# Patient Record
Sex: Male | Born: 2000 | Race: White | Hispanic: Yes | Marital: Single | Smoking: Former smoker
Health system: Southern US, Community
[De-identification: ages and names within clinical notes are randomized; demographics above are authoritative.]

## PROBLEM LIST (undated history)

## (undated) DIAGNOSIS — Z789 Other specified health status: Secondary | ICD-10-CM

## (undated) DIAGNOSIS — K8020 Calculus of gallbladder without cholecystitis without obstruction: Secondary | ICD-10-CM

---

## 2019-12-21 ENCOUNTER — Other Ambulatory Visit: Payer: Self-pay

## 2019-12-21 ENCOUNTER — Encounter (HOSPITAL_BASED_OUTPATIENT_CLINIC_OR_DEPARTMENT_OTHER): Payer: Self-pay | Admitting: *Deleted

## 2019-12-21 DIAGNOSIS — R1013 Epigastric pain: Secondary | ICD-10-CM | POA: Insufficient documentation

## 2019-12-21 NOTE — ED Triage Notes (Signed)
C/o right upper abd pain x 1 year

## 2019-12-22 ENCOUNTER — Emergency Department (HOSPITAL_BASED_OUTPATIENT_CLINIC_OR_DEPARTMENT_OTHER): Payer: Self-pay

## 2019-12-22 ENCOUNTER — Encounter (HOSPITAL_BASED_OUTPATIENT_CLINIC_OR_DEPARTMENT_OTHER): Payer: Self-pay | Admitting: Emergency Medicine

## 2019-12-22 ENCOUNTER — Emergency Department (HOSPITAL_BASED_OUTPATIENT_CLINIC_OR_DEPARTMENT_OTHER)
Admission: EM | Admit: 2019-12-22 | Discharge: 2019-12-22 | Disposition: A | Discharge status: Home / Self Care | Payer: Self-pay | Attending: Emergency Medicine | Admitting: Emergency Medicine

## 2019-12-22 ENCOUNTER — Emergency Department (HOSPITAL_COMMUNITY): Admission: EM | Admit: 2019-12-22 | Discharge: 2019-12-22 | Discharge status: Absent without leave | Payer: Self-pay

## 2019-12-22 ENCOUNTER — Emergency Department (HOSPITAL_BASED_OUTPATIENT_CLINIC_OR_DEPARTMENT_OTHER)
Admit: 2019-12-22 | Discharge: 2019-12-22 | Disposition: A | Discharge status: Home / Self Care | Payer: Self-pay | Attending: Emergency Medicine | Admitting: Emergency Medicine

## 2019-12-22 ENCOUNTER — Other Ambulatory Visit: Payer: Self-pay

## 2019-12-22 ENCOUNTER — Encounter (HOSPITAL_BASED_OUTPATIENT_CLINIC_OR_DEPARTMENT_OTHER): Payer: Self-pay

## 2019-12-22 DIAGNOSIS — R110 Nausea: Secondary | ICD-10-CM | POA: Insufficient documentation

## 2019-12-22 DIAGNOSIS — Z20822 Contact with and (suspected) exposure to covid-19: Secondary | ICD-10-CM | POA: Insufficient documentation

## 2019-12-22 DIAGNOSIS — K8020 Calculus of gallbladder without cholecystitis without obstruction: Secondary | ICD-10-CM

## 2019-12-22 DIAGNOSIS — Z87891 Personal history of nicotine dependence: Secondary | ICD-10-CM | POA: Insufficient documentation

## 2019-12-22 DIAGNOSIS — R1013 Epigastric pain: Secondary | ICD-10-CM

## 2019-12-22 DIAGNOSIS — K810 Acute cholecystitis: Secondary | ICD-10-CM | POA: Insufficient documentation

## 2019-12-22 LAB — CBC WITH DIFFERENTIAL/PLATELET
Abs Immature Granulocytes: 0.07 10*3/uL (ref 0.00–0.07)
Abs Immature Granulocytes: 0.03 10*3/uL (ref 0.00–0.07)
Basophils Absolute: 0.1 10*3/uL (ref 0.0–0.1)
Basophils Absolute: 0 10*3/uL (ref 0.0–0.1)
Basophils Relative: 0 %
Basophils Relative: 0 %
Eosinophils Absolute: 0 10*3/uL (ref 0.0–0.5)
Eosinophils Absolute: 0.2 10*3/uL (ref 0.0–0.5)
Eosinophils Relative: 0 %
Eosinophils Relative: 2 %
HCT: 42.5 % (ref 39.0–52.0)
HCT: 45.4 % (ref 39.0–52.0)
Hemoglobin: 14.1 g/dL (ref 13.0–17.0)
Hemoglobin: 14.9 g/dL (ref 13.0–17.0)
Immature Granulocytes: 1 %
Immature Granulocytes: 0 %
Lymphocytes Relative: 11 %
Lymphocytes Relative: 22 %
Lymphs Abs: 1.7 10*3/uL (ref 0.7–4.0)
Lymphs Abs: 2.5 10*3/uL (ref 0.7–4.0)
MCH: 25.6 pg — ABNORMAL LOW (ref 26.0–34.0)
MCH: 25.5 pg — ABNORMAL LOW (ref 26.0–34.0)
MCHC: 33.2 g/dL (ref 30.0–36.0)
MCHC: 32.8 g/dL (ref 30.0–36.0)
MCV: 77.3 fL — ABNORMAL LOW (ref 80.0–100.0)
MCV: 77.7 fL — ABNORMAL LOW (ref 80.0–100.0)
Monocytes Absolute: 0.8 10*3/uL (ref 0.1–1.0)
Monocytes Absolute: 1 10*3/uL (ref 0.1–1.0)
Monocytes Relative: 6 %
Monocytes Relative: 9 %
Neutro Abs: 11.9 10*3/uL — ABNORMAL HIGH (ref 1.7–7.7)
Neutro Abs: 7.6 10*3/uL (ref 1.7–7.7)
Neutrophils Relative %: 82 %
Neutrophils Relative %: 67 %
Platelets: 306 10*3/uL (ref 150–400)
Platelets: 303 10*3/uL (ref 150–400)
RBC: 5.5 MIL/uL (ref 4.22–5.81)
RBC: 5.84 MIL/uL — ABNORMAL HIGH (ref 4.22–5.81)
RDW: 12.9 % (ref 11.5–15.5)
RDW: 13.2 % (ref 11.5–15.5)
WBC: 14.6 10*3/uL — ABNORMAL HIGH (ref 4.0–10.5)
WBC: 11.3 10*3/uL — ABNORMAL HIGH (ref 4.0–10.5)
nRBC: 0 % (ref 0.0–0.2)
nRBC: 0 % (ref 0.0–0.2)

## 2019-12-22 LAB — RESPIRATORY PANEL BY RT PCR (FLU A&B, COVID)
Influenza A by PCR: NEGATIVE
Influenza B by PCR: NEGATIVE
SARS Coronavirus 2 by RT PCR: NEGATIVE

## 2019-12-22 LAB — COMPREHENSIVE METABOLIC PANEL
ALT: 19 U/L (ref 0–44)
ALT: 18 U/L (ref 0–44)
AST: 21 U/L (ref 15–41)
AST: 18 U/L (ref 15–41)
Albumin: 4.2 g/dL (ref 3.5–5.0)
Albumin: 4.3 g/dL (ref 3.5–5.0)
Alkaline Phosphatase: 48 U/L (ref 38–126)
Alkaline Phosphatase: 53 U/L (ref 38–126)
Anion gap: 8 (ref 5–15)
Anion gap: 7 (ref 5–15)
BUN: 12 mg/dL (ref 6–20)
BUN: 9 mg/dL (ref 6–20)
CO2: 26 mmol/L (ref 22–32)
CO2: 27 mmol/L (ref 22–32)
Calcium: 9 mg/dL (ref 8.9–10.3)
Calcium: 9 mg/dL (ref 8.9–10.3)
Chloride: 100 mmol/L (ref 98–111)
Chloride: 104 mmol/L (ref 98–111)
Creatinine, Ser: 0.64 mg/dL (ref 0.61–1.24)
Creatinine, Ser: 0.91 mg/dL (ref 0.61–1.24)
GFR, Estimated: >60 mL/min (ref >60)
GFR, Estimated: >60 mL/min (ref >60)
Glucose, Bld: 121 mg/dL — ABNORMAL HIGH (ref 70–99)
Glucose, Bld: 96 mg/dL (ref 70–99)
Potassium: 3.9 mmol/L (ref 3.5–5.1)
Potassium: 3.8 mmol/L (ref 3.5–5.1)
Sodium: 134 mmol/L — ABNORMAL LOW (ref 135–145)
Sodium: 138 mmol/L (ref 135–145)
Total Bilirubin: 0.4 mg/dL (ref 0.3–1.2)
Total Bilirubin: 1 mg/dL (ref 0.3–1.2)
Total Protein: 7.3 g/dL (ref 6.5–8.1)
Total Protein: 7.6 g/dL (ref 6.5–8.1)

## 2019-12-22 LAB — LIPASE, BLOOD
Lipase: 25 U/L (ref 11–51)
Lipase: 25 U/L (ref 11–51)

## 2019-12-22 MED ORDER — DICYCLOMINE HCL 20 MG PO TABS: 20.0000 mg | ORAL_TABLET | Freq: Three times a day (TID) | ORAL | 0 refills | Status: DC | PRN

## 2019-12-22 MED ORDER — DICYCLOMINE HCL 10 MG PO CAPS
10.0000 mg | ORAL_CAPSULE | Freq: Once | ORAL | Status: AC
  Administered 2019-12-22: 10 mg via ORAL
  Filled 2019-12-22: qty 1

## 2019-12-22 MED ORDER — ONDANSETRON 4 MG PO TBDP
4.0000 mg | ORAL_TABLET | Freq: Once | ORAL | Status: AC
  Administered 2019-12-22: 4 mg via ORAL
  Filled 2019-12-22: qty 1

## 2019-12-22 MED ORDER — IOHEXOL 300 MG/ML  SOLN
100.0000 mL | Freq: Once | INTRAMUSCULAR | Status: AC | PRN
  Administered 2019-12-22: 100 mL via INTRAVENOUS

## 2019-12-22 MED ORDER — SODIUM CHLORIDE 0.9 % IV SOLN
INTRAVENOUS | Status: AC
  Filled 2019-12-22: qty 20

## 2019-12-22 MED ORDER — SODIUM CHLORIDE 0.9 % IV SOLN
2.0000 g | Freq: Once | INTRAVENOUS | Status: DC
  Filled 2019-12-22: qty 20

## 2019-12-22 MED ORDER — ONDANSETRON 4 MG PO TBDP: 4.0000 mg | ORAL_TABLET | Freq: Three times a day (TID) | ORAL | 0 refills | Status: DC | PRN

## 2019-12-22 MED ORDER — PANTOPRAZOLE SODIUM 20 MG PO TBEC: 20.0000 mg | DELAYED_RELEASE_TABLET | Freq: Every day | ORAL | 0 refills | Status: AC

## 2019-12-22 NOTE — Discharge Instructions (Addendum)
Stay on a low-fat diet, and drink plenty of fluids, like water.  Call the general surgeon on Monday morning for follow-up appointment.

## 2019-12-22 NOTE — ED Provider Notes (Addendum)
[facility] EMERGENCY DEPARTMENT Provider Note   CSN: [id number] Arrival date & time: 12/22/19  1447     History Chief Complaint  Patient presents with  . Abdominal Pain    Mike Hahn is a 19 y.o. male.  HPI      Mike Hahn is a 19 y.o. male, patient with no diagnosed past medical history, presenting to the ED with abdominal pain constant since yesterday evening around 6 PM. Patient states he has had intermittent pain in the right upper quadrant of the abdomen for the last couple years.  This worsened and became more frequent over the last week and has been constant since onset yesterday.  Occasional nausea.  Patient was evaluated in the ED last night.  There was concern for possible early cholecystitis.  Patient returned today for RUQ ultrasound, which continued to show concern for acute cholecystitis.  No COVID-19 vaccine.  Last meal was yesterday afternoon.  Last food of any kind was some crackers prior to discharge around 4 AM this morning.  Denies fever/chills, vomiting, diarrhea, hematochezia/melena, chest pain, shortness of breath, or any other complaints.    No past medical history on file.  There are no problems to display for this patient.   No past surgical history on file.     No family history on file.  Social History   Tobacco Use  . Smoking status: Former Games developer  . Smokeless tobacco: Never Used  Vaping Use  . Vaping Use: Never used  Substance Use Topics  . Alcohol use: Not Currently  . Drug use: Not Currently    Home Medications Prior to Admission medications   Medication Sig Start Date End Date Taking? Authorizing Provider  dicyclomine (BENTYL) 20 MG tablet Take 1 tablet (20 mg total) by mouth 3 (three) times daily as needed for spasms. 12/22/19   Long, Arlyss Repress, MD  ondansetron (ZOFRAN ODT) 4 MG disintegrating tablet Take 1 tablet (4 mg total) by mouth every 8 (eight) hours as needed for nausea. 12/22/19   Long, Arlyss Repress,  MD  pantoprazole (PROTONIX) 20 MG tablet Take 1 tablet (20 mg total) by mouth daily. 12/22/19 01/21/20  Long, Arlyss Repress, MD    Allergies    Patient has no known allergies.  Review of Systems   Review of Systems  Constitutional: Negative for chills, diaphoresis and fever.  Respiratory: Negative for cough and shortness of breath.   Cardiovascular: Negative for chest pain.  Gastrointestinal: Positive for abdominal pain and nausea. Negative for blood in stool, constipation, diarrhea and vomiting.  Genitourinary: Negative for dysuria and flank pain.  Musculoskeletal: Negative for back pain.  Neurological: Negative for syncope and weakness.  All other systems reviewed and are negative.   Physical Exam Updated Vital Signs BP 124/87 (BP Location: Left Arm)   Pulse 96   Temp 98.5 F (36.9 C) (Oral)   Resp 20   Ht 5\' 11"  (1.803 m)   Wt 98 kg   SpO2 98%   BMI 30.13 kg/m   Physical Exam Vitals and nursing note reviewed.  Constitutional:      General: He is not in acute distress.    Appearance: He is well-developed. He is not diaphoretic.  HENT:     Head: Normocephalic and atraumatic.     Mouth/Throat:     Mouth: Mucous membranes are moist.     Pharynx: Oropharynx is clear.  Eyes:     Conjunctiva/sclera: Conjunctivae normal.  Cardiovascular:     Rate and  Rhythm: Normal rate and regular rhythm.     Pulses: Normal pulses.          Radial pulses are 2+ on the right side and 2+ on the left side.       Posterior tibial pulses are 2+ on the right side and 2+ on the left side.     Heart sounds: Normal heart sounds.  Pulmonary:     Effort: Pulmonary effort is normal. No respiratory distress.     Breath sounds: Normal breath sounds.  Abdominal:     Palpations: Abdomen is soft.     Tenderness: There is abdominal tenderness in the right upper quadrant. There is no guarding. Positive signs include Murphy's sign.  Musculoskeletal:     Cervical back: Neck supple.     Right lower leg: No  edema.     Left lower leg: No edema.  Lymphadenopathy:     Cervical: No cervical adenopathy.  Skin:    General: Skin is warm and dry.  Neurological:     Mental Status: He is alert.  Psychiatric:        Mood and Affect: Mood and affect normal.        Speech: Speech normal.        Behavior: Behavior normal.     ED Results / Procedures / Treatments   Labs (all labs ordered are listed, but only abnormal results are displayed) Labs Reviewed  RESPIRATORY PANEL BY RT PCR (FLU A&B, COVID)  COMPREHENSIVE METABOLIC PANEL  CBC WITH DIFFERENTIAL/PLATELET  LIPASE, BLOOD    EKG None  Radiology CT ABDOMEN PELVIS W CONTRAST  Result Date: 12/22/2019 CLINICAL DATA:  Right upper quadrant abdominal pain EXAM: CT ABDOMEN AND PELVIS WITH CONTRAST TECHNIQUE: Multidetector CT imaging of the abdomen and pelvis was performed using the standard protocol following bolus administration of intravenous contrast. CONTRAST:  OMNIPAQUE IOHEXOL 300 MG/ML  SOLN COMPARISON:  None. FINDINGS: Lower chest: The visualized lung bases are clear bilaterally. The visualized heart and pericardium are unremarkable peer Hepatobiliary: The gallbladder is mildly distended and subtle Peri cholecystic inflammatory changes are identified within the porta hepatis surrounding the gallbladder neck suggesting early changes of acute cholecystitis. No intra or extrahepatic biliary ductal dilation. The liver is unremarkable. Pancreas: Unremarkable Spleen: Unremarkable Adrenals/Urinary Tract: The adrenal glands are unremarkable. The kidneys are normal in size and position. Normal cortical enhancement. There is mild bilateral hydronephrosis and hydroureter to the level of the bladder which appears moderately distended suggesting that this may relate to either bladder outlet obstruction or voluntary retention. No intrarenal or ureteral calculi are identified. Stomach/Bowel: The stomach, small bowel, and large bowel are unremarkable. Appendix  normal. No free intraperitoneal gas or fluid. Vascular/Lymphatic: No significant vascular findings are present. No enlarged abdominal or pelvic lymph nodes. Reproductive: Prostate is unremarkable. Other: Rectum unremarkable. Musculoskeletal: No acute bone abnormality. IMPRESSION: Mild pericholecystic inflammatory changes suggestive of early acute cholecystitis. Correlation with liver enzymes may be helpful for further evaluation. HIDA scan may also be helpful to assess patency of the cystic duct. Mild bilateral hydronephrosis and moderate bladder distension suggesting changes of either bladder outlet obstruction or voluntary retention. Electronically Signed   By: Helyn Numbers MD   On: 12/22/2019 02:52   US Abdomen Limited RUQ/Gall Bladder  Result Date: 12/22/2019 CLINICAL DATA:  Epigastric and right upper quadrant abdominal pain for 1 year, worsening over the last week. Gallbladder abnormality on CT. EXAM: ULTRASOUND ABDOMEN LIMITED RIGHT UPPER QUADRANT COMPARISON:  CT same date. FINDINGS:  Gallbladder: There is an immobile 14 mm gallstone in the gallbladder neck. Mild gallbladder thickening to 4 mm is present. According to the sonographer, there is a sonographic Murphy sign. Common bile duct: Diameter: 3 mm Liver: No focal lesion identified. Within normal limits in parenchymal echogenicity. Portal vein is patent on color Doppler imaging with normal direction of blood flow towards the liver. Other: None. IMPRESSION: 1. Nonmobile gallstone in the gallbladder neck with mild gallbladder wall thickening and a sonographic Murphy sign. Findings are supportive of acute cholecystitis as suggested on earlier CT. 2. No evidence of biliary dilatation. Electronically Signed   By: Carey Bullocks M.D.   On: 12/22/2019 14:24    Procedures Procedures (including critical care time)  Medications Ordered in ED Medications  cefTRIAXone (ROCEPHIN) 2 g in sodium chloride 0.9 % 100 mL IVPB (has no administration in time  range)    ED Course  I have reviewed the triage vital signs and the nursing notes.  Pertinent labs & imaging results that were available during my care of the patient were reviewed by me and considered in my medical decision making (see chart for details).  Clinical Course as of Dec 22 1598  Sat Dec 22, 2019  1552 Spoke with Dr. Carolynne Edouard, general surgeon.  Recommends patient be transferred to the ED at [facility].  He will evaluate him there.   [SJ]  1553 Spoke with Dr. Effie Shy, EDP at [facility].  Agrees to accept the transfer.   [SJ]    Clinical Course User Index [SJ] Astella Desir, Lorella Nimrod   MDM Rules/Calculators/A&P                          Patient presented to our ED after completing RUQ ultrasound. Ultrasound with findings concerning for acute cholecystitis.  Patient does have consistent right upper quadrant abdominal pain. Patient is nontoxic appearing, afebrile, not tachycardic, not tachypneic, not hypotensive, excellent SPO2 on room air, and is in no apparent distress.   I have reviewed the patient's chart to obtain more information.   I reviewed and interpreted the patient's labs and radiological studies. He will be in ED to ED transfer to [facility] emergency department.  He has requested to do this POV.  We counseled the patient on proceeding directly to the emergency department without stopping and while remaining n.p.o. Results of repeat lab work pending at time of transfer.  Patient will be driven to the ED by a friend who is present at the bedside.  Upon arrival at the [facility], ED, he will need antibiotics initiated and surgery notified of his arrival.  Findings and plan of care discussed with Marianna Fuss, MD.   Final Clinical Impression(s) / ED Diagnoses Final diagnoses:  Acute cholecystitis    Rx / DC Orders ED Discharge Orders    None       Concepcion Living 12/22/19 1605    Anselm Pancoast, PA-C 12/22/19 1606    Milagros Loll, MD 12/22/19  [phone]

## 2019-12-22 NOTE — ED Provider Notes (Addendum)
4:50 PM-patient presented apparently from [facility], to see general surgery regarding gallstone with signs and symptoms of acute cholecystitis.  Patient reports having similar pain for 1 year.  Not previously diagnosed with gallbladder disorder.  Will page general surgery to report that the patient has arrived.   Mancel Bale, MD 12/22/19 1657  7:45 PM-patient has been seen by general surgery who have discussed the situation with the patient.  Patient and surgeon plan on outpatient follow-up for cholecystectomy, electively.  Dr. Carolynne Edouard will have his office contact the patient and I will give the patient Dr. Billey Chang number to call on Monday.  Patient will be given instructions for low-fat diet.  I have reviewed patient's vital signs, he remained stable, and comfortable.    Mancel Bale, MD 12/22/19 [phone]

## 2019-12-22 NOTE — ED Notes (Signed)
Pt reports that he was told to come here to see surgery from [facility]. Pt handed this RN his paperwork, being seen for acute cholecystitis.

## 2019-12-22 NOTE — ED Provider Notes (Signed)
Emergency Department Provider Note   I have reviewed the triage vital signs and the nursing notes.   HISTORY  Chief Complaint Abdominal Pain   HPI Mike Hahn is a 19 y.o. male presents to the emergency department for evaluation of epigastric and right upper quadrant abdominal pain which is been intermittent over the past year but worsening significantly in the past 7 days.  He states that in the past he has had occasional discomfort in the epigastric and RUQ region.  Pain does not radiate up into the chest.  He is not having shortness of breath or fever.  He has not noticed any association with eating or drinking.  He typically takes Motrin and pain resolves.  He has not seen a physician regarding this pain in the past.  Over the past week the pain has become more severe and constant.  No lower abdominal discomfort.  No urinary tract infection symptoms.  No back pain.    History reviewed. No pertinent past medical history.  There are no problems to display for this patient.   History reviewed. No pertinent surgical history.  Allergies Patient has no known allergies.  No family history on file.  Social History Social History   Tobacco Use  . Smoking status: Never Smoker  . Smokeless tobacco: Never Used  Vaping Use  . Vaping Use: Never used  Substance Use Topics  . Alcohol use: Not Currently  . Drug use: Not Currently    Review of Systems  Constitutional: No fever/chills Eyes: No visual changes. ENT: No sore throat. Cardiovascular: Denies chest pain. Respiratory: Denies shortness of breath. Gastrointestinal: Positive epigastric and RUQ abdominal pain.  No nausea, no vomiting.  No diarrhea.  No constipation. Genitourinary: Negative for dysuria. Musculoskeletal: Negative for back pain. Skin: Negative for rash. Neurological: Negative for headaches, focal weakness or numbness.  10-point ROS otherwise  negative.  ____________________________________________   PHYSICAL EXAM:  VITAL SIGNS: ED Triage Vitals  Enc Vitals Group     BP 12/21/19 2305 (!) 152/84     Pulse Rate 12/21/19 2305 74     Resp 12/21/19 2305 18     Temp 12/21/19 2305 98.2 F (36.8 C)     Temp Source 12/21/19 2305 Oral     SpO2 12/21/19 2305 100 %     Weight 12/21/19 2303 216 lb (98 kg)     Height 12/21/19 2303 5\' 11"  (1.803 m)   Constitutional: Alert and oriented. Well appearing and in no acute distress. Eyes: Conjunctivae are normal.  Head: Atraumatic. Nose: No congestion/rhinnorhea. Mouth/Throat: Mucous membranes are moist. Neck: No stridor. Cardiovascular: Normal rate, regular rhythm. Good peripheral circulation. Grossly normal heart sounds.   Respiratory: Normal respiratory effort.  No retractions. Lungs CTAB. Gastrointestinal: Soft with mild tenderness in the epigastric region. . No rebound or guarding. Negative Murphy's sign. No distention.  Musculoskeletal: No gross deformities of extremities. Neurologic:  Normal speech and language.  Skin:  Skin is warm, dry and intact. No rash noted.   ____________________________________________   LABS (all labs ordered are listed, but only abnormal results are displayed)  Labs Reviewed  COMPREHENSIVE METABOLIC PANEL - Abnormal; Notable for the following components:      Result Value   Sodium 134 (*)    Glucose, Bld 121 (*)    All other components within normal limits  CBC WITH DIFFERENTIAL/PLATELET - Abnormal; Notable for the following components:   WBC 14.6 (*)    MCV 77.3 (*)    MCH 25.6 (*)  Neutro Abs 11.9 (*)    All other components within normal limits  LIPASE, BLOOD   ____________________________________________  RADIOLOGY  CT ABDOMEN PELVIS W CONTRAST  Result Date: 12/22/2019 CLINICAL DATA:  Right upper quadrant abdominal pain EXAM: CT ABDOMEN AND PELVIS WITH CONTRAST TECHNIQUE: Multidetector CT imaging of the abdomen and pelvis was  performed using the standard protocol following bolus administration of intravenous contrast. CONTRAST:  OMNIPAQUE IOHEXOL 300 MG/ML  SOLN COMPARISON:  None. FINDINGS: Lower chest: The visualized lung bases are clear bilaterally. The visualized heart and pericardium are unremarkable peer Hepatobiliary: The gallbladder is mildly distended and subtle Peri cholecystic inflammatory changes are identified within the porta hepatis surrounding the gallbladder neck suggesting early changes of acute cholecystitis. No intra or extrahepatic biliary ductal dilation. The liver is unremarkable. Pancreas: Unremarkable Spleen: Unremarkable Adrenals/Urinary Tract: The adrenal glands are unremarkable. The kidneys are normal in size and position. Normal cortical enhancement. There is mild bilateral hydronephrosis and hydroureter to the level of the bladder which appears moderately distended suggesting that this may relate to either bladder outlet obstruction or voluntary retention. No intrarenal or ureteral calculi are identified. Stomach/Bowel: The stomach, small bowel, and large bowel are unremarkable. Appendix normal. No free intraperitoneal gas or fluid. Vascular/Lymphatic: No significant vascular findings are present. No enlarged abdominal or pelvic lymph nodes. Reproductive: Prostate is unremarkable. Other: Rectum unremarkable. Musculoskeletal: No acute bone abnormality. IMPRESSION: Mild pericholecystic inflammatory changes suggestive of early acute cholecystitis. Correlation with liver enzymes may be helpful for further evaluation. HIDA scan may also be helpful to assess patency of the cystic duct. Mild bilateral hydronephrosis and moderate bladder distension suggesting changes of either bladder outlet obstruction or voluntary retention. Electronically Signed   By: Helyn Numbers MD   On: 12/22/2019 02:52    ____________________________________________   PROCEDURES  Procedure(s) performed:   Procedures  None   ____________________________________________   INITIAL IMPRESSION / ASSESSMENT AND PLAN / ED COURSE  Pertinent labs & imaging results that were available during my care of the patient were reviewed by me and considered in my medical decision making (see chart for details).   Patient presents to the emergency department with epigastric and right upper quadrant abdominal pain present for the past year but worsening significantly in the last 7 days.  No prior labs or imaging in our system for review.  Plan for CMP, lipase, CT abdomen pelvis given his history and exam.  Exceedingly low suspicion for cardiopulmonary etiology of symptoms.   03:15 AM  Patient CT imaging reviewed.  LFTs and bilirubin are normal.  There is some inflammation surrounding the gallbladder with raises the possibility of an early acute cholecystitis but my suspicion for this overall is relatively low.  On reexamination of his abdomen he has no significant tenderness in the right upper quadrant.  Negative Murphy sign.  Patient symptoms are more consistent with a gastritis.  He does have leukocytosis but is afebrile here with otherwise normal vital signs.  Discussed symptoms with the patient and plan to bring him back tomorrow when ultrasound is available for right upper quadrant ultrasound to better assess the gallbladder.  Will p.o. challenge here prior to discharge. Patient is urinating here without difficulty. Suspect voluntary retention while in CT.   Patient eating and drinking in the ED without worsening symptoms. Plan for 1PM return for RUQ [place]. Discussed staying NPO prior to study. Discussed ED return precautions.  ____________________________________________  FINAL CLINICAL IMPRESSION(S) / ED DIAGNOSES  Final diagnoses:  Epigastric pain  MEDICATIONS GIVEN DURING THIS VISIT:  Medications  iohexol (OMNIPAQUE) 300 MG/ML solution 100 mL (100 mLs Intravenous Contrast Given 12/22/19 0201)  dicyclomine (BENTYL)  capsule 10 mg (10 mg Oral Given 12/22/19 0334)  ondansetron (ZOFRAN-ODT) disintegrating tablet 4 mg (4 mg Oral Given 12/22/19 0334)     NEW OUTPATIENT MEDICATIONS STARTED DURING THIS VISIT:  New Prescriptions   DICYCLOMINE (BENTYL) 20 MG TABLET    Take 1 tablet (20 mg total) by mouth 3 (three) times daily as needed for spasms.   ONDANSETRON (ZOFRAN ODT) 4 MG DISINTEGRATING TABLET    Take 1 tablet (4 mg total) by mouth every 8 (eight) hours as needed for nausea.   PANTOPRAZOLE (PROTONIX) 20 MG TABLET    Take 1 tablet (20 mg total) by mouth daily.    Note:  This document was prepared using Dragon voice recognition software and may include unintentional dictation errors.  Alona Bene, MD, [facility] Emergency Medicine    Masiel Gentzler, Arlyss Repress, MD 12/22/19 [phone]

## 2019-12-22 NOTE — ED Notes (Signed)
ED Provider at bedside. 

## 2019-12-22 NOTE — Discharge Instructions (Addendum)
IMPORTANT PATIENT INSTRUCTIONS:  You have been scheduled for an Outpatient Ultrasound.    Your appointment has been scheduled for:  __1    PM later today.   If your appointment is scheduled for a Saturday, Sunday or holiday, please go to the [facility] Emergency Department Registration Desk at least 15 minutes prior to your appointment time and tell them you are there for an ultrasound.    Do not eat or drink anything prior to this test as eating can affect how we interpret the study.   If your appointment is scheduled for a weekday (Monday - Friday), please go directly to the [facility] Radiology Department reception area at least 15 minutes prior to your appointment time and tell them you are there for an ultrasound.    Please call [phone] with questions.

## 2019-12-22 NOTE — Consult Note (Signed)
Reason for Consult:abd pain Referring Physician: Dr. Sharlett Iles Mike Hahn is an 19 y.o. male.  HPI: The patient is a 19 year old white male who has been complaining of epigastric pain for the last year.  It seems to be worse over the last 24 hours.  He denies any fevers or chills.  He denies any nausea or vomiting.  He went to the emergency department where imaging showed that he did have stones in the gallbladder.  His liver functions were normal.  No past medical history on file.  No past surgical history on file.  No family history on file.  Social History:  reports that he has quit smoking. He has never used smokeless tobacco. He reports previous alcohol use. He reports previous drug use.  Allergies: No Known Allergies  Medications: I have reviewed the patient's current medications.  Results for orders placed or performed during the hospital encounter of 12/22/19 (from the past 48 hour(s))  Comprehensive metabolic panel     Status: None   Collection Time: 12/22/19  3:54 PM  Result Value Ref Range   Sodium 138 135 - 145 mmol/L   Potassium 3.8 3.5 - 5.1 mmol/L   Chloride 104 98 - 111 mmol/L   CO2 27 22 - 32 mmol/L   Glucose, Bld 96 70 - 99 mg/dL    Comment: Glucose reference range applies only to samples taken after fasting for at least 8 hours.   BUN 9 6 - 20 mg/dL   Creatinine, Ser 2.35 0.61 - 1.24 mg/dL   Calcium 9.0 8.9 - 57.3 mg/dL   Total Protein 7.6 6.5 - 8.1 g/dL   Albumin 4.3 3.5 - 5.0 g/dL   AST 18 15 - 41 U/L   ALT 18 0 - 44 U/L   Alkaline Phosphatase 53 38 - 126 U/L   Total Bilirubin 1.0 0.3 - 1.2 mg/dL   GFR, Estimated >22 >02 mL/min   Anion gap 7 5 - 15    Comment: Performed at [facility], [address] [place], [place] [address]  CBC with Differential     Status: Abnormal   Collection Time: 12/22/19  3:54 PM  Result Value Ref Range   WBC 11.3 (H) 4.0 - 10.5 K/uL   RBC 5.84 (H) 4.22 - 5.81 MIL/uL   Hemoglobin 14.9 13.0 - 17.0 g/dL   HCT  62.3 39 - 52 %   MCV 77.7 (L) 80.0 - 100.0 fL   MCH 25.5 (L) 26.0 - 34.0 pg   MCHC 32.8 30.0 - 36.0 g/dL   RDW 76.2 83.1 - 51.7 %   Platelets 303 150 - 400 K/uL   nRBC 0.0 0.0 - 0.2 %   Neutrophils Relative % 67 %   Neutro Abs 7.6 1.7 - 7.7 K/uL   Lymphocytes Relative 22 %   Lymphs Abs 2.5 0.7 - 4.0 K/uL   Monocytes Relative 9 %   Monocytes Absolute 1.0 0.1 - 1.0 K/uL   Eosinophils Relative 2 %   Eosinophils Absolute 0.2 0 - 0 K/uL   Basophils Relative 0 %   Basophils Absolute 0.0 0 - 0 K/uL   Immature Granulocytes 0 %   Abs Immature Granulocytes 0.03 0.00 - 0.07 K/uL    Comment: Performed at [facility], [address] [place], [place] [address]  Lipase, blood     Status: None   Collection Time: 12/22/19  3:54 PM  Result Value Ref Range   Lipase 25 11 -  51 U/L    Comment: Performed at [facility], [address] [place], [place] [address]  Respiratory Panel by RT PCR (Flu A&B, Covid) - Nasopharyngeal Swab     Status: None   Collection Time: 12/22/19  3:54 PM   Specimen: Nasopharyngeal Swab  Result Value Ref Range   SARS Coronavirus 2 by RT PCR NEGATIVE NEGATIVE    Comment: (NOTE) SARS-CoV-2 target nucleic acids are NOT DETECTED.  The SARS-CoV-2 RNA is generally detectable in upper respiratoy specimens during the acute phase of infection. The lowest concentration of SARS-CoV-2 viral copies this assay can detect is 131 copies/mL. A negative result does not preclude SARS-Cov-2 infection and should not be used as the sole basis for treatment or other patient management decisions. A negative result may occur with  improper specimen collection/handling, submission of specimen other than nasopharyngeal swab, presence of viral mutation(s) within the areas targeted by this assay, and inadequate number of viral copies (<131 copies/mL). A negative result must be combined with clinical observations, patient history, and epidemiological information.  The expected result is Negative.  Fact Sheet for Patients:  https://www.moore.com/  Fact Sheet for Healthcare Providers:  https://www.young.biz/  This test is no t yet approved or cleared by the [place] FDA and  has been authorized for detection and/or diagnosis of SARS-CoV-2 by FDA under an Emergency Use Authorization (EUA). This EUA will remain  in effect (meaning this test can be used) for the duration of the COVID-19 declaration under Section 564(b)(1) of the Act, 21 U.S.C. section 360bbb-3(b)(1), unless the authorization is terminated or revoked sooner.     Influenza A by PCR NEGATIVE NEGATIVE   Influenza B by PCR NEGATIVE NEGATIVE    Comment: (NOTE) The Xpert Xpress SARS-CoV-2/FLU/RSV assay is intended as an aid in  the diagnosis of influenza from Nasopharyngeal swab specimens and  should not be used as a sole basis for treatment. Nasal washings and  aspirates are unacceptable for Xpert Xpress SARS-CoV-2/FLU/RSV  testing.  Fact Sheet for Patients: https://www.moore.com/  Fact Sheet for Healthcare Providers: https://www.young.biz/  This test is not yet approved or cleared by the [place] FDA and  has been authorized for detection and/or diagnosis of SARS-CoV-2 by  FDA under an Emergency Use Authorization (EUA). This EUA will remain  in effect (meaning this test can be used) for the duration of the  Covid-19 declaration under Section 564(b)(1) of the Act, 21  U.S.C. section 360bbb-3(b)(1), unless the authorization is  terminated or revoked. Performed at [facility], [address] [place], [place] [address]     CT ABDOMEN PELVIS W CONTRAST  Result Date: 12/22/2019 CLINICAL DATA:  Right upper quadrant abdominal pain EXAM: CT ABDOMEN AND PELVIS WITH CONTRAST TECHNIQUE: Multidetector CT imaging of the abdomen and pelvis was performed using the standard protocol following  bolus administration of intravenous contrast. CONTRAST:  OMNIPAQUE IOHEXOL 300 MG/ML  SOLN COMPARISON:  None. FINDINGS: Lower chest: The visualized lung bases are clear bilaterally. The visualized heart and pericardium are unremarkable peer Hepatobiliary: The gallbladder is mildly distended and subtle Peri cholecystic inflammatory changes are identified within the porta hepatis surrounding the gallbladder neck suggesting early changes of acute cholecystitis. No intra or extrahepatic biliary ductal dilation. The liver is unremarkable. Pancreas: Unremarkable Spleen: Unremarkable Adrenals/Urinary Tract: The adrenal glands are unremarkable. The kidneys are normal in size and position. Normal cortical enhancement. There is mild bilateral hydronephrosis and hydroureter to the level of the bladder which  appears moderately distended suggesting that this may relate to either bladder outlet obstruction or voluntary retention. No intrarenal or ureteral calculi are identified. Stomach/Bowel: The stomach, small bowel, and large bowel are unremarkable. Appendix normal. No free intraperitoneal gas or fluid. Vascular/Lymphatic: No significant vascular findings are present. No enlarged abdominal or pelvic lymph nodes. Reproductive: Prostate is unremarkable. Other: Rectum unremarkable. Musculoskeletal: No acute bone abnormality. IMPRESSION: Mild pericholecystic inflammatory changes suggestive of early acute cholecystitis. Correlation with liver enzymes may be helpful for further evaluation. HIDA scan may also be helpful to assess patency of the cystic duct. Mild bilateral hydronephrosis and moderate bladder distension suggesting changes of either bladder outlet obstruction or voluntary retention. Electronically Signed   By: Helyn NumbersAshesh  Parikh MD   On: 12/22/2019 02:52   US Abdomen Limited RUQ/Gall Bladder  Result Date: 12/22/2019 CLINICAL DATA:  Epigastric and right upper quadrant abdominal pain for 1 year, worsening over the  last week. Gallbladder abnormality on CT. EXAM: ULTRASOUND ABDOMEN LIMITED RIGHT UPPER QUADRANT COMPARISON:  CT same date. FINDINGS: Gallbladder: There is an immobile 14 mm gallstone in the gallbladder neck. Mild gallbladder thickening to 4 mm is present. According to the sonographer, there is a sonographic Murphy sign. Common bile duct: Diameter: 3 mm Liver: No focal lesion identified. Within normal limits in parenchymal echogenicity. Portal vein is patent on color Doppler imaging with normal direction of blood flow towards the liver. Other: None. IMPRESSION: 1. Nonmobile gallstone in the gallbladder neck with mild gallbladder wall thickening and a sonographic Murphy sign. Findings are supportive of acute cholecystitis as suggested on earlier CT. 2. No evidence of biliary dilatation. Electronically Signed   By: Carey BullocksWilliam  Veazey M.D.   On: 12/22/2019 14:24    Review of Systems  Constitutional: Negative.   HENT: Negative.   Eyes: Negative.   Respiratory: Negative.   Cardiovascular: Negative.   Gastrointestinal: Positive for abdominal pain. Negative for nausea and vomiting.  Endocrine: Negative.   Genitourinary: Negative.   Musculoskeletal: Negative.   Skin: Negative.   Allergic/Immunologic: Negative.   Neurological: Negative.   Hematological: Negative.   Psychiatric/Behavioral: Negative.    Blood pressure 119/73, pulse 88, temperature 98.7 F (37.1 C), temperature source Oral, resp. rate 18, height 5\' 11"  (1.803 m), weight 98 kg, SpO2 100 %. Physical Exam Constitutional:      General: He is not in acute distress.    Appearance: Normal appearance. He is normal weight. He is not ill-appearing.  HENT:     Head: Normocephalic and atraumatic.     Right Ear: External ear normal.     Left Ear: External ear normal.     Nose: Nose normal.     Mouth/Throat:     Mouth: Mucous membranes are dry.  Eyes:     General: No scleral icterus.    Extraocular Movements: Extraocular movements intact.      Conjunctiva/sclera: Conjunctivae normal.     Pupils: Pupils are equal, round, and reactive to light.  Cardiovascular:     Rate and Rhythm: Normal rate and regular rhythm.     Pulses: Normal pulses.     Heart sounds: Normal heart sounds.     Comments: No pitting edema lower extr Abdominal:     General: Abdomen is flat. Bowel sounds are normal. There is no distension.     Tenderness: There is no abdominal tenderness.  Musculoskeletal:        General: No swelling, tenderness or deformity. Normal range of motion.     Cervical back:  Normal range of motion and neck supple. No tenderness.  Lymphadenopathy:     Comments: No palpable groin or cervical lymphadenopathy  Skin:    General: Skin is warm and dry.  Neurological:     General: No focal deficit present.     Mental Status: He is alert and oriented to person, place, and time.  Psychiatric:        Mood and Affect: Mood normal.        Behavior: Behavior normal.        Thought Content: Thought content normal.     Assessment/Plan: The patient appears to have symptomatic gallstones.  He currently has no abdominal pain and looks normal.  I have given them the option of staying in the hospital and having this done as the operating room schedule allows versus going home and having this scheduled in the next few weeks electively.  He would like to go home.  I will have the office call him on Monday to begin the scheduling process for elective cholecystectomy.  Chevis Pretty III 12/22/2019, 7:46 PM

## 2019-12-22 NOTE — ED Triage Notes (Signed)
Pt arrives pov, reports RUQ pain. States U/S today, was told to check in for abx.

## 2019-12-24 ENCOUNTER — Ambulatory Visit: Payer: Self-pay | Admitting: General Surgery

## 2020-01-22 ENCOUNTER — Ambulatory Visit: Payer: Self-pay | Admitting: General Surgery

## 2020-02-01 ENCOUNTER — Ambulatory Visit: Payer: Self-pay | Admitting: General Surgery

## 2020-03-24 ENCOUNTER — Other Ambulatory Visit (HOSPITAL_COMMUNITY)
Admission: RE | Admit: 2020-03-24 | Discharge: 2020-03-24 | Disposition: A | Discharge status: Home / Self Care | Payer: Self-pay | Source: Ambulatory Visit | Attending: General Surgery | Admitting: General Surgery

## 2020-03-24 DIAGNOSIS — Z01812 Encounter for preprocedural laboratory examination: Secondary | ICD-10-CM | POA: Insufficient documentation

## 2020-03-24 DIAGNOSIS — Z20822 Contact with and (suspected) exposure to covid-19: Secondary | ICD-10-CM | POA: Insufficient documentation

## 2020-03-24 LAB — SARS CORONAVIRUS 2 (TAT 6-24 HRS): SARS Coronavirus 2: NEGATIVE

## 2020-03-25 ENCOUNTER — Encounter (HOSPITAL_COMMUNITY): Payer: Self-pay | Admitting: General Surgery

## 2020-03-25 NOTE — Progress Notes (Signed)
Denies chest pain, shortness of breath, or cardiology visit. Educated on Museum/gallery conservator. Patient reports being in quarantine and will remain in quarantine until after surgery.

## 2020-03-27 ENCOUNTER — Encounter (HOSPITAL_COMMUNITY): Payer: Self-pay | Admitting: General Surgery

## 2020-03-27 ENCOUNTER — Other Ambulatory Visit: Payer: Self-pay

## 2020-03-27 ENCOUNTER — Ambulatory Visit (HOSPITAL_COMMUNITY): Payer: Self-pay

## 2020-03-27 ENCOUNTER — Ambulatory Visit (HOSPITAL_COMMUNITY): Payer: Self-pay | Admitting: Certified Registered Nurse Anesthetist

## 2020-03-27 ENCOUNTER — Ambulatory Visit (HOSPITAL_COMMUNITY)
Admission: RE | Admit: 2020-03-27 | Discharge: 2020-03-27 | Disposition: A | Discharge status: Home / Self Care | Payer: Self-pay | Attending: General Surgery | Admitting: General Surgery

## 2020-03-27 ENCOUNTER — Encounter (HOSPITAL_COMMUNITY)
Admission: RE | Disposition: A | Discharge status: Home / Self Care | Payer: Self-pay | Source: Home / Self Care | Attending: General Surgery

## 2020-03-27 DIAGNOSIS — Z87891 Personal history of nicotine dependence: Secondary | ICD-10-CM | POA: Insufficient documentation

## 2020-03-27 DIAGNOSIS — K8010 Calculus of gallbladder with chronic cholecystitis without obstruction: Secondary | ICD-10-CM | POA: Insufficient documentation

## 2020-03-27 DIAGNOSIS — Z419 Encounter for procedure for purposes other than remedying health state, unspecified: Secondary | ICD-10-CM

## 2020-03-27 HISTORY — PX: CHOLECYSTECTOMY: SHX55

## 2020-03-27 HISTORY — DX: Other specified health status: Z78.9

## 2020-03-27 HISTORY — DX: Calculus of gallbladder without cholecystitis without obstruction: K80.20

## 2020-03-27 SURGERY — LAPAROSCOPIC CHOLECYSTECTOMY WITH INTRAOPERATIVE CHOLANGIOGRAM
Anesthesia: General | Site: Abdomen

## 2020-03-27 MED ORDER — ACETAMINOPHEN 500 MG PO TABS: 1000.0000 mg | ORAL_TABLET | ORAL | Status: AC

## 2020-03-27 MED ORDER — FENTANYL CITRATE (PF) 250 MCG/5ML IJ SOLN
INTRAMUSCULAR | Status: DC | PRN
  Administered 2020-03-27: 50 ug via INTRAVENOUS
  Administered 2020-03-27: 150 ug via INTRAVENOUS
  Administered 2020-03-27: 50 ug via INTRAVENOUS

## 2020-03-27 MED ORDER — CHLORHEXIDINE GLUCONATE 0.12 % MT SOLN: 15.0000 mL | Freq: Once | OROMUCOSAL | Status: AC

## 2020-03-27 MED ORDER — BUPIVACAINE HCL (PF) 0.25 % IJ SOLN
INTRAMUSCULAR | Status: AC
  Filled 2020-03-27: qty 30

## 2020-03-27 MED ORDER — ROCURONIUM BROMIDE 10 MG/ML (PF) SYRINGE
PREFILLED_SYRINGE | INTRAVENOUS | Status: DC | PRN
  Administered 2020-03-27: 60 mg via INTRAVENOUS

## 2020-03-27 MED ORDER — GABAPENTIN 300 MG PO CAPS: 300.0000 mg | ORAL_CAPSULE | ORAL | Status: AC

## 2020-03-27 MED ORDER — CHLORHEXIDINE GLUCONATE CLOTH 2 % EX PADS: 6.0000 | MEDICATED_PAD | Freq: Once | CUTANEOUS | Status: DC

## 2020-03-27 MED ORDER — ROCURONIUM BROMIDE 10 MG/ML (PF) SYRINGE
PREFILLED_SYRINGE | INTRAVENOUS | Status: AC
  Filled 2020-03-27: qty 10

## 2020-03-27 MED ORDER — ORAL CARE MOUTH RINSE: 15.0000 mL | Freq: Once | OROMUCOSAL | Status: AC

## 2020-03-27 MED ORDER — DEXAMETHASONE SODIUM PHOSPHATE 10 MG/ML IJ SOLN
INTRAMUSCULAR | Status: DC | PRN
  Administered 2020-03-27: 10 mg via INTRAVENOUS

## 2020-03-27 MED ORDER — DEXAMETHASONE SODIUM PHOSPHATE 10 MG/ML IJ SOLN
INTRAMUSCULAR | Status: AC
  Filled 2020-03-27: qty 1

## 2020-03-27 MED ORDER — HYDROMORPHONE HCL 1 MG/ML IJ SOLN
INTRAMUSCULAR | Status: AC
  Filled 2020-03-27: qty 1

## 2020-03-27 MED ORDER — HYDROCODONE-ACETAMINOPHEN 5-325 MG PO TABS: 1.0000 | ORAL_TABLET | Freq: Four times a day (QID) | ORAL | 0 refills | Status: AC | PRN

## 2020-03-27 MED ORDER — CELECOXIB 200 MG PO CAPS
ORAL_CAPSULE | ORAL | Status: AC
  Administered 2020-03-27: 200 mg via ORAL
  Filled 2020-03-27: qty 1

## 2020-03-27 MED ORDER — SODIUM CHLORIDE 0.9 % IV SOLN
INTRAVENOUS | Status: DC | PRN
  Administered 2020-03-27: 7 mL

## 2020-03-27 MED ORDER — CELECOXIB 200 MG PO CAPS: 200.0000 mg | ORAL_CAPSULE | ORAL | Status: AC

## 2020-03-27 MED ORDER — CHLORHEXIDINE GLUCONATE 0.12 % MT SOLN
OROMUCOSAL | Status: AC
  Administered 2020-03-27: 15 mL via OROMUCOSAL
  Filled 2020-03-27: qty 15

## 2020-03-27 MED ORDER — SODIUM CHLORIDE 0.9 % IR SOLN
Status: DC | PRN
  Administered 2020-03-27: 1000 mL

## 2020-03-27 MED ORDER — BUPIVACAINE HCL (PF) 0.25 % IJ SOLN
INTRAMUSCULAR | Status: DC | PRN
  Administered 2020-03-27: 17 mL

## 2020-03-27 MED ORDER — MIDAZOLAM HCL 2 MG/2ML IJ SOLN
INTRAMUSCULAR | Status: DC | PRN
  Administered 2020-03-27: 2 mg via INTRAVENOUS

## 2020-03-27 MED ORDER — CEFAZOLIN SODIUM-DEXTROSE 2-4 GM/100ML-% IV SOLN
2.0000 g | INTRAVENOUS | Status: AC
  Administered 2020-03-27: 2 g via INTRAVENOUS

## 2020-03-27 MED ORDER — LIDOCAINE 2% (20 MG/ML) 5 ML SYRINGE
INTRAMUSCULAR | Status: DC | PRN
  Administered 2020-03-27: 60 mg via INTRAVENOUS

## 2020-03-27 MED ORDER — ONDANSETRON HCL 4 MG/2ML IJ SOLN: 4.0000 mg | Freq: Once | INTRAMUSCULAR | Status: DC | PRN

## 2020-03-27 MED ORDER — OXYCODONE HCL 5 MG/5ML PO SOLN: 5.0000 mg | Freq: Once | ORAL | Status: DC | PRN

## 2020-03-27 MED ORDER — ONDANSETRON HCL 4 MG/2ML IJ SOLN
INTRAMUSCULAR | Status: AC
  Filled 2020-03-27: qty 2

## 2020-03-27 MED ORDER — ONDANSETRON HCL 4 MG/2ML IJ SOLN
INTRAMUSCULAR | Status: DC | PRN
  Administered 2020-03-27: 4 mg via INTRAVENOUS

## 2020-03-27 MED ORDER — LACTATED RINGERS IV SOLN: INTRAVENOUS | Status: DC

## 2020-03-27 MED ORDER — 0.9 % SODIUM CHLORIDE (POUR BTL) OPTIME
TOPICAL | Status: DC | PRN
  Administered 2020-03-27: 1000 mL

## 2020-03-27 MED ORDER — GABAPENTIN 300 MG PO CAPS
ORAL_CAPSULE | ORAL | Status: AC
  Administered 2020-03-27: 300 mg via ORAL
  Filled 2020-03-27: qty 1

## 2020-03-27 MED ORDER — FENTANYL CITRATE (PF) 250 MCG/5ML IJ SOLN
INTRAMUSCULAR | Status: AC
  Filled 2020-03-27: qty 5

## 2020-03-27 MED ORDER — OXYCODONE HCL 5 MG PO TABS: 5.0000 mg | ORAL_TABLET | Freq: Once | ORAL | Status: DC | PRN

## 2020-03-27 MED ORDER — PROPOFOL 10 MG/ML IV BOLUS
INTRAVENOUS | Status: AC
  Filled 2020-03-27: qty 20

## 2020-03-27 MED ORDER — CEFAZOLIN SODIUM-DEXTROSE 2-4 GM/100ML-% IV SOLN
INTRAVENOUS | Status: AC
  Filled 2020-03-27: qty 100

## 2020-03-27 MED ORDER — HYDROMORPHONE HCL 1 MG/ML IJ SOLN
0.2500 mg | INTRAMUSCULAR | Status: DC | PRN
  Administered 2020-03-27 (×2): 0.25 mg via INTRAVENOUS

## 2020-03-27 MED ORDER — LIDOCAINE 2% (20 MG/ML) 5 ML SYRINGE
INTRAMUSCULAR | Status: AC
  Filled 2020-03-27: qty 5

## 2020-03-27 MED ORDER — PROPOFOL 10 MG/ML IV BOLUS
INTRAVENOUS | Status: DC | PRN
  Administered 2020-03-27: 200 mg via INTRAVENOUS

## 2020-03-27 MED ORDER — BUPIVACAINE-EPINEPHRINE (PF) 0.25% -1:200000 IJ SOLN
INTRAMUSCULAR | Status: AC
  Filled 2020-03-27: qty 30

## 2020-03-27 MED ORDER — SUGAMMADEX SODIUM 200 MG/2ML IV SOLN
INTRAVENOUS | Status: DC | PRN
  Administered 2020-03-27: 200 mg via INTRAVENOUS

## 2020-03-27 MED ORDER — ACETAMINOPHEN 500 MG PO TABS
ORAL_TABLET | ORAL | Status: AC
  Administered 2020-03-27: 1000 mg via ORAL
  Filled 2020-03-27: qty 2

## 2020-03-27 SURGICAL SUPPLY — 35 items
APPLIER CLIP 5 13 M/L LIGAMAX5 (MISCELLANEOUS) ×2
BLADE CLIPPER SURG (BLADE) IMPLANT
CANISTER SUCT 3000ML PPV (MISCELLANEOUS) ×2 IMPLANT
CATH REDDICK CHOLANGI 4FR 50CM (CATHETERS) ×2 IMPLANT
CHLORAPREP W/TINT 26 (MISCELLANEOUS) ×2 IMPLANT
CLIP APPLIE 5 13 M/L LIGAMAX5 (MISCELLANEOUS) ×1 IMPLANT
COVER MAYO STAND STRL (DRAPES) ×2 IMPLANT
COVER SURGICAL LIGHT HANDLE (MISCELLANEOUS) ×2 IMPLANT
COVER WAND RF STERILE (DRAPES) IMPLANT
DERMABOND ADVANCED (GAUZE/BANDAGES/DRESSINGS) ×1
DERMABOND ADVANCED .7 DNX12 (GAUZE/BANDAGES/DRESSINGS) ×1 IMPLANT
DRAPE C-ARM 42X120 X-RAY (DRAPES) ×2 IMPLANT
ELECT REM PT RETURN 9FT ADLT (ELECTROSURGICAL) ×2
ELECTRODE REM PT RTRN 9FT ADLT (ELECTROSURGICAL) ×1 IMPLANT
GLOVE BIO SURGEON STRL SZ7.5 (GLOVE) ×2 IMPLANT
GOWN STRL REUS W/ TWL LRG LVL3 (GOWN DISPOSABLE) ×3 IMPLANT
GOWN STRL REUS W/TWL LRG LVL3 (GOWN DISPOSABLE) ×3
IV CATH 14GX2 1/4 (CATHETERS) ×2 IMPLANT
KIT BASIN OR (CUSTOM PROCEDURE TRAY) ×2 IMPLANT
KIT TURNOVER KIT B (KITS) ×2 IMPLANT
NS IRRIG 1000ML POUR BTL (IV SOLUTION) ×2 IMPLANT
PAD ARMBOARD 7.5X6 YLW CONV (MISCELLANEOUS) ×2 IMPLANT
POUCH SPECIMEN RETRIEVAL 10MM (ENDOMECHANICALS) ×2 IMPLANT
SCISSORS LAP 5X35 DISP (ENDOMECHANICALS) ×2 IMPLANT
SET IRRIG TUBING LAPAROSCOPIC (IRRIGATION / IRRIGATOR) ×2 IMPLANT
SET TUBE SMOKE EVAC HIGH FLOW (TUBING) ×2 IMPLANT
SLEEVE ENDOPATH XCEL 5M (ENDOMECHANICALS) ×4 IMPLANT
SPECIMEN JAR SMALL (MISCELLANEOUS) ×2 IMPLANT
SUT MNCRL AB 4-0 PS2 18 (SUTURE) ×2 IMPLANT
TOWEL GREEN STERILE (TOWEL DISPOSABLE) ×2 IMPLANT
TOWEL GREEN STERILE FF (TOWEL DISPOSABLE) ×2 IMPLANT
TRAY LAPAROSCOPIC MC (CUSTOM PROCEDURE TRAY) ×2 IMPLANT
TROCAR XCEL BLUNT TIP 100MML (ENDOMECHANICALS) ×2 IMPLANT
TROCAR XCEL NON-BLD 5MMX100MML (ENDOMECHANICALS) ×2 IMPLANT
WATER STERILE IRR 1000ML POUR (IV SOLUTION) ×2 IMPLANT

## 2020-03-27 NOTE — H&P (Signed)
Mike Hahn  Location: [facility] Patient #: [id number] DOB: 07-Aug-2000 Single / Language: [name] / Race: Refused to Report/Unreported Male   History of Present Illness  The patient is a 20 year old male who presents with abdominal pain. We are asked to see the patient in consultation by Dr. Mancel Bale to evaluate him for gallstones. The patient is a 20 year old male who has been experiencing right upper quadrant pain for the last year. The pain occurs about every 2-3 days. When the pain occurs it lasts about an hour to accept when it's real severe and then it can last for several hours. He denies any nausea or vomiting associated with it. He quit smoking about a year ago. A recent ultrasound and CT scan did show a stone in the gallbladder but no gallbladder wall thickening or ductal dilatation. His liver functions were normal.   Past Surgical History No pertinent past surgical history   Diagnostic Studies History  Colonoscopy  never  Allergies  No Known Drug Allergies   Medication History  No Current Medications Medications Reconciled  Social History  Alcohol use  Occasional alcohol use. Caffeine use  Coffee. Illicit drug use  Uses yearly. Tobacco use  Former smoker.  Family History Family history unknown  First Degree Relatives   Other Problems  Bladder Problems  Cholelithiasis     Review of Systems  General Not Present- Appetite Loss, Chills, Fatigue, Fever, Night Sweats, Weight Gain and Weight Loss. Skin Not Present- Change in Wart/Mole, Dryness, Hives, Jaundice, New Lesions, Non-Healing Wounds, Rash and Ulcer. HEENT Present- Nose Bleed and Sore Throat. Not Present- Earache, Hearing Loss, Hoarseness, Oral Ulcers, Ringing in the Ears, Seasonal Allergies, Sinus Pain, Visual Disturbances, Wears glasses/contact lenses and Yellow Eyes. Cardiovascular Not Present- Chest Pain, Difficulty Breathing Lying Down, Leg Cramps, Palpitations,  Rapid Heart Rate, Shortness of Breath and Swelling of Extremities. Gastrointestinal Not Present- Abdominal Pain, Bloating, Bloody Stool, Change in Bowel Habits, Chronic diarrhea, Constipation, Difficulty Swallowing, Excessive gas, Gets full quickly at meals, Hemorrhoids, Indigestion, Nausea, Rectal Pain and Vomiting. Male Genitourinary Not Present- Blood in Urine, Change in Urinary Stream, Frequency, Impotence, Nocturia, Painful Urination, Urgency and Urine Leakage.  Vitals Weight: 199.13 lb Height: 68in Body Surface Area: 2.04 m Body Mass Index: 30.28 kg/m  Temp.: 98.38F  Pulse: 70 (Regular)  P.OX: 98% (Room air) BP: 138/84(Sitting, Left Arm, Standard)       Physical Exam  General Mental Status-Alert. General Appearance-Consistent with stated age. Hydration-Well hydrated. Voice-Normal.  Head and Neck Head-normocephalic, atraumatic with no lesions or palpable masses. Trachea-midline. Thyroid Gland Characteristics - normal size and consistency.  Eye Eyeball - Bilateral-Extraocular movements intact. Sclera/Conjunctiva - Bilateral-No scleral icterus.  Chest and Lung Exam Chest and lung exam reveals -quiet, even and easy respiratory effort with no use of accessory muscles and on auscultation, normal breath sounds, no adventitious sounds and normal vocal resonance. Inspection Chest Wall - Normal. Back - normal.  Cardiovascular Cardiovascular examination reveals -normal heart sounds, regular rate and rhythm with no murmurs and normal pedal pulses bilaterally.  Abdomen Inspection Inspection of the abdomen reveals - No Hernias. Skin - Scar - no surgical scars. Palpation/Percussion Palpation and Percussion of the abdomen reveal - Soft, Non Tender, No Rebound tenderness, No Rigidity (guarding) and No hepatosplenomegaly. Auscultation Auscultation of the abdomen reveals - Bowel sounds normal.  Neurologic Neurologic evaluation reveals -alert and  oriented x 3 with no impairment of recent or remote memory. Mental Status-Normal.  Musculoskeletal Normal Exam - Left-Upper  Extremity Strength Normal and Lower Extremity Strength Normal. Normal Exam - Right-Upper Extremity Strength Normal and Lower Extremity Strength Normal.  Lymphatic Head & Neck  General Head & Neck Lymphatics: Bilateral - Description - Normal. Axillary  General Axillary Region: Bilateral - Description - Normal. Tenderness - Non Tender. Femoral & Inguinal  Generalized Femoral & Inguinal Lymphatics: Bilateral - Description - Normal. Tenderness - Non Tender.    Assessment & Plan   Impression: The patient appears to have symptomatic gallstones. Because of the risk of further painful episodes and possible pancreatitis I believe he would benefit from having his gallbladder removed. He would also like to have this done. I have discussed him in detail the risks and benefits of the operation to remove the gallbladder as well as some of the technical aspects including the risk of common duct injury and he understands and wishes to proceed. I will plan for a laparoscopic cholecystectomy with intraoperative cholangiogram. This patient encounter took 30 minutes today to perform the following: take history, perform exam, review outside records, interpret imaging, counsel the patient on their diagnosis and document encounter, findings & plan in the EHR

## 2020-03-27 NOTE — Interval H&P Note (Signed)
History and Physical Interval Note:  03/27/2020 11:11 AM  Mike Hahn  has presented today for surgery, with the diagnosis of GALLSTONES.  The various methods of treatment have been discussed with the patient and family. After consideration of risks, benefits and other options for treatment, the patient has consented to  Procedure(s): LAPAROSCOPIC CHOLECYSTECTOMY WITH INTRAOPERATIVE CHOLANGIOGRAM (N/A) as a surgical intervention.  The patient's history has been reviewed, patient examined, no change in status, stable for surgery.  I have reviewed the patient's chart and labs.  Questions were answered to the patient's satisfaction.     Chevis Pretty III

## 2020-03-27 NOTE — Anesthesia Preprocedure Evaluation (Addendum)
Anesthesia Evaluation  Patient identified by MRN, date of birth, ID band Patient awake    Reviewed: Allergy & Precautions, NPO status , Patient's Chart, lab work & pertinent test results  Airway Mallampati: II  TM Distance: >3 FB Neck ROM: Full    Dental  (+) Missing,    Pulmonary former smoker,    Pulmonary exam normal breath sounds clear to auscultation       Cardiovascular negative cardio ROS Normal cardiovascular exam Rhythm:Regular Rate:Normal     Neuro/Psych negative neurological ROS  negative psych ROS   GI/Hepatic Neg liver ROS, Cholelithiasis with chronic cholecystitis   Endo/Other  negative endocrine ROS  Renal/GU negative Renal ROS  negative genitourinary   Musculoskeletal negative musculoskeletal ROS (+)   Abdominal   Peds  Hematology negative hematology ROS (+)   Anesthesia Other Findings   Reproductive/Obstetrics                             Anesthesia Physical Anesthesia Plan  ASA: II  Anesthesia Plan: General   Post-op Pain Management:    Induction: Intravenous  PONV Risk Score and Plan: 4 or greater and Treatment may vary due to age or medical condition, Midazolam, Ondansetron and Dexamethasone  Airway Management Planned: Oral ETT  Additional Equipment:   Intra-op Plan:   Post-operative Plan: Extubation in OR  Informed Consent: I have reviewed the patients History and Physical, chart, labs and discussed the procedure including the risks, benefits and alternatives for the proposed anesthesia with the patient or authorized representative who has indicated his/her understanding and acceptance.     Dental advisory given  Plan Discussed with: CRNA, Anesthesiologist and Surgeon  Anesthesia Plan Comments:         Anesthesia Quick Evaluation

## 2020-03-27 NOTE — Anesthesia Postprocedure Evaluation (Signed)
Anesthesia Post Note  Patient: Mike Hahn  Procedure(s) Performed: LAPAROSCOPIC CHOLECYSTECTOMY WITH INTRAOPERATIVE CHOLANGIOGRAM (N/A Abdomen)     Patient location during evaluation: PACU Anesthesia Type: General Level of consciousness: awake and alert and oriented Pain management: pain level controlled Vital Signs Assessment: post-procedure vital signs reviewed and stable Respiratory status: spontaneous breathing, nonlabored ventilation and respiratory function stable Cardiovascular status: blood pressure returned to baseline and stable Postop Assessment: no apparent nausea or vomiting Anesthetic complications: no   No complications documented.  Last Vitals:  Vitals:   03/27/20 1345 03/27/20 1356  BP: 140/85 (!) 149/80  Pulse: 83 93  Resp: 18 18  Temp:  (!) 36.4 C  SpO2: 99% 100%    Last Pain:  Vitals:   03/27/20 1356  TempSrc:   PainSc: 4                  Vicky Schleich A.

## 2020-03-27 NOTE — Transfer of Care (Signed)
Immediate Anesthesia Transfer of Care Note  Patient: Mike Hahn  Procedure(s) Performed: LAPAROSCOPIC CHOLECYSTECTOMY WITH INTRAOPERATIVE CHOLANGIOGRAM (N/A Abdomen)  Patient Location: PACU  Anesthesia Type:General  Level of Consciousness: drowsy, patient cooperative and responds to stimulation  Airway & Oxygen Therapy: Patient Spontanous Breathing and Patient connected to face mask oxygen  Post-op Assessment: Report given to RN and Post -op Vital signs reviewed and stable  Post vital signs: Reviewed and stable  Last Vitals:  Vitals Value Taken Time  BP 137/96 03/27/20 1300  Temp 36.4 C 03/27/20 1300  Pulse 99 03/27/20 1308  Resp 43 03/27/20 1307  SpO2 96 % 03/27/20 1308  Vitals shown include unvalidated device data.  Last Pain:  Vitals:   03/27/20 1300  TempSrc:   PainSc: Asleep         Complications: No complications documented.

## 2020-03-27 NOTE — Anesthesia Procedure Notes (Signed)
Procedure Name: Intubation Date/Time: 03/27/2020 11:42 AM Performed by: Verdie Drown, CRNA Pre-anesthesia Checklist: Patient identified, Emergency Drugs available, Suction available and Patient being monitored Patient Re-evaluated:Patient Re-evaluated prior to induction Oxygen Delivery Method: Circle System Utilized Preoxygenation: Pre-oxygenation with 100% oxygen Induction Type: IV induction Ventilation: Mask ventilation without difficulty Laryngoscope Size: Mac and 3 Grade View: Grade I Tube type: Oral Tube size: 7.5 mm Number of attempts: 1 Airway Equipment and Method: Stylet and Oral airway Placement Confirmation: ETT inserted through vocal cords under direct vision,  positive ETCO2 and breath sounds checked- equal and bilateral Secured at: 22 cm Tube secured with: Tape Dental Injury: Teeth and Oropharynx as per pre-operative assessment

## 2020-03-27 NOTE — Op Note (Signed)
03/27/2020  12:42 PM  PATIENT:  Mike Hahn  20 y.o. male  PRE-OPERATIVE DIAGNOSIS:  GALLSTONES  POST-OPERATIVE DIAGNOSIS:  GALLSTONES  PROCEDURE:  Procedure(s): LAPAROSCOPIC CHOLECYSTECTOMY WITH INTRAOPERATIVE CHOLANGIOGRAM (N/A)  SURGEON:  Surgeon(s) and Role:    * Griselda Miner, MD - Primary  PHYSICIAN ASSISTANT:   ASSISTANTS: Myrtie Soman, RNFA   ANESTHESIA:   local and general  EBL:  minimal   BLOOD ADMINISTERED:none  DRAINS: none   LOCAL MEDICATIONS USED:  MARCAINE     SPECIMEN:  Source of Specimen:  gallbladder  DISPOSITION OF SPECIMEN:  PATHOLOGY  COUNTS:  YES  TOURNIQUET:  * No tourniquets in log *  DICTATION: .Dragon Dictation     Procedure: After informed consent was obtained the patient was brought to the operating room and placed in the supine position on the operating room table. After adequate induction of general anesthesia the patient's abdomen was prepped with ChloraPrep allowed to dry and draped in usual sterile manner. An appropriate timeout was performed. The area below the umbilicus was infiltrated with quarter percent  Marcaine. A small incision was made with a 15 blade knife. The incision was carried down through the subcutaneous tissue bluntly with a hemostat and [employer] retractors. The linea alba was identified. The linea alba was incised with a 15 blade knife and each side was grasped with Coker clamps. The preperitoneal space was then probed with a hemostat until the peritoneum was opened and access was gained to the abdominal cavity. A 0 Vicryl pursestring stitch was placed in the fascia surrounding the opening. A Hassan cannula was then placed through the opening and anchored in place with the previously placed Vicryl purse string stitch. The abdomen was insufflated with carbon dioxide without difficulty. A laparoscope was inserted through the [facility] cannula in the right upper quadrant was inspected. Next the epigastric region was  infiltrated with % Marcaine. A small incision was made with a 15 blade knife. A 5 mm port was placed bluntly through this incision into the abdominal cavity under direct vision. Next 2 sites were chosen laterally on the right side of the abdomen for placement of 5 mm ports. Each of these areas was infiltrated with quarter percent Marcaine. Small stab incisions were made with a 15 blade knife. 5 mm ports were then placed bluntly through these incisions into the abdominal cavity under direct vision without difficulty. A blunt grasper was placed through the lateralmost 5 mm port and used to grasp the dome of the gallbladder and elevated anteriorly and superiorly. Another blunt grasper was placed through the other 5 mm port and used to retract the body and neck of the gallbladder. A dissector was placed through the epigastric port and using the electrocautery the peritoneal reflection at the gallbladder neck was opened. Blunt dissection was then carried out in this area until the gallbladder neck-cystic duct junction was readily identified and a good window was created. A single clip was placed on the gallbladder neck. A small  ductotomy was made just below the clip with laparoscopic scissors. A 14-gauge Angiocath was then placed through the anterior abdominal wall under direct vision. A Reddick cholangiogram catheter was then placed through the Angiocath and flushed. The catheter was then placed in the cystic duct and anchored in place with a clip. A cholangiogram was obtained that showed no filling defects good emptying into the duodenum an adequate length on the cystic duct. The anchoring clip and catheters were then removed from the patient. 3 clips  were placed proximally on the cystic duct and the duct was divided between the 2 sets of clips. Posterior to this the cystic artery was identified and again dissected bluntly in a circumferential manner until a good window  was created. 2 clips were placed proximally  and one distally on the artery and the artery was divided between the 2 sets of clips. Next a laparoscopic hook cautery device was used to separate the gallbladder from the liver bed. Prior to completely detaching the gallbladder from the liver bed the liver bed was inspected and several small bleeding points were coagulated with the electrocautery until the area was completely hemostatic. The gallbladder was then detached the rest of it from the liver bed without difficulty. A laparoscopic bag was inserted through the hassan port. The laparoscope was moved to the epigastric port. The gallbladder was placed within the bag and the bag was sealed.  The bag with the gallbladder was then removed with the [facility] cannula through the infraumbilical port without difficulty. The fascial defect was then closed with the previously placed Vicryl pursestring stitch as well as with another figure-of-eight 0 Vicryl stitch. The liver bed was inspected again and found to be hemostatic. The abdomen was irrigated with copious amounts of saline until the effluent was clear. The ports were then removed under direct vision without difficulty and were found to be hemostatic. The gas was allowed to escape. The skin incisions were all closed with interrupted 4-0 Monocryl subcuticular stitches. Dermabond dressings were applied. The patient tolerated the procedure well. At the end of the case all needle sponge and instrument counts were correct. The patient was then awakened and taken to recovery in stable condition  PLAN OF CARE: Discharge to home after PACU  PATIENT DISPOSITION:  PACU - hemodynamically stable.   Delay start of Pharmacological VTE agent (>24hrs) due to surgical blood loss or risk of bleeding: not applicable

## 2020-03-27 NOTE — Progress Notes (Signed)
Waiting for patient ride. Vital signs stable. No further monitoring required.   Nasiah Polinsky, RN  

## 2020-03-28 ENCOUNTER — Encounter (HOSPITAL_COMMUNITY): Payer: Self-pay | Admitting: General Surgery

## 2020-03-28 LAB — SURGICAL PATHOLOGY

## 2022-03-11 IMAGING — CT CT ABD-PELV W/ CM
2 of 4 series · 16 of 46 positions shown, 18 images · IV contrast (Omnipaque)
Comparison: None.

CLINICAL DATA: Right upper quadrant abdominal pain

EXAM:
CT ABDOMEN AND PELVIS WITH CONTRAST
TECHNIQUE: Multidetector CT imaging of the abdomen and pelvis was performed
using the standard protocol following bolus administration of
intravenous contrast.
CONTRAST:  100mL OMNIPAQUE IOHEXOL 300 MG/ML  SOLN

[Series 2: axial st · axial · 0.79mm/px · z∈[-465,-20]mm · 13 of 97 slices shown, 15 images]
[im 4/97  soft-tissue]
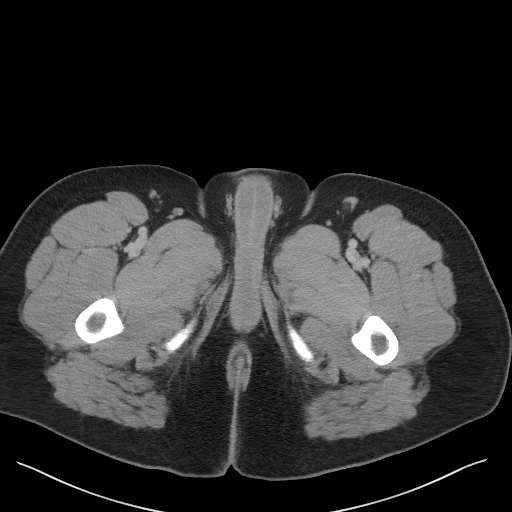
[im 4/97  bone]
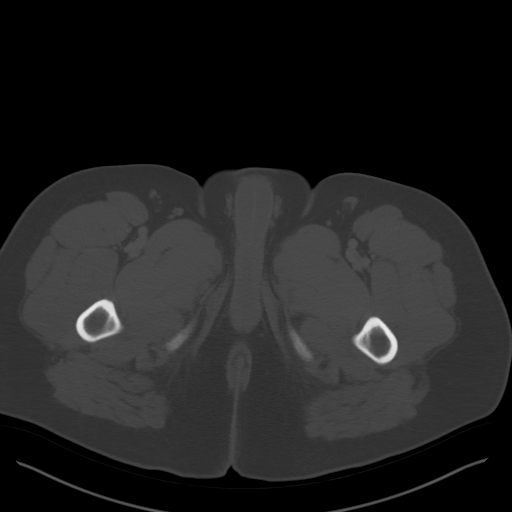
[im 12/97  soft-tissue]
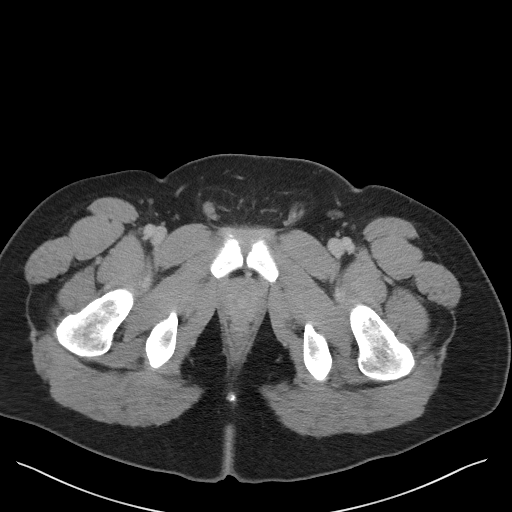
[im 20/97  soft-tissue]
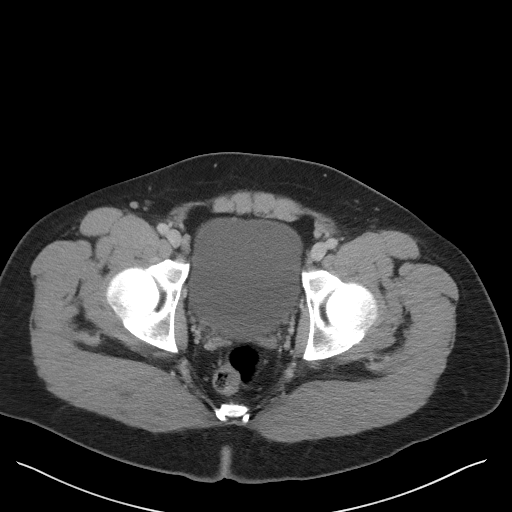
[im 27/97  soft-tissue]
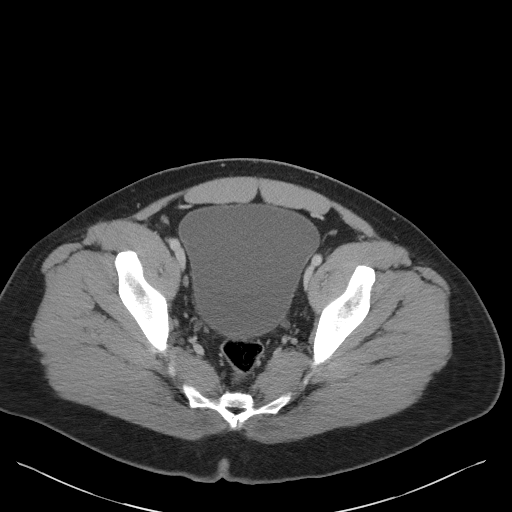
[im 35/97  soft-tissue]
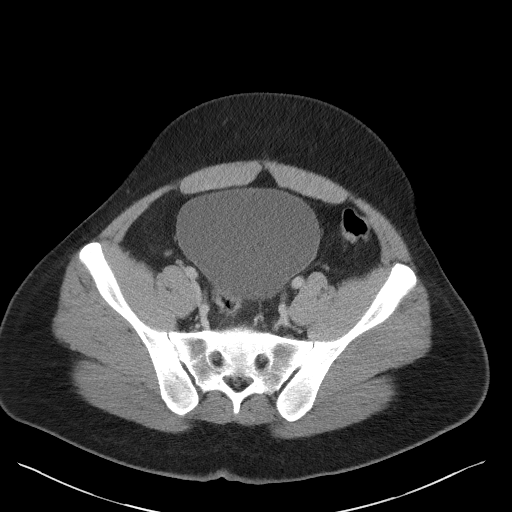
[im 43/97  soft-tissue]
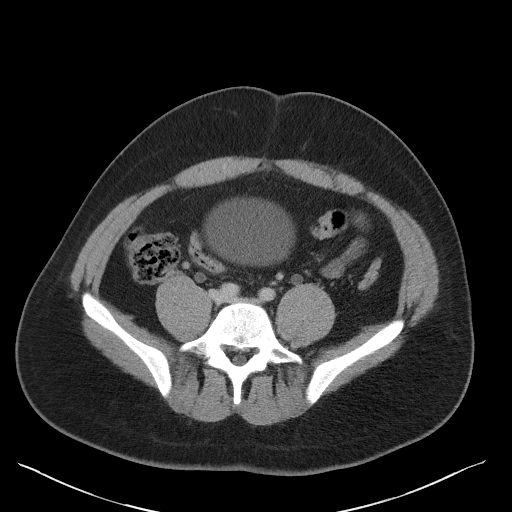
[im 50/97  soft-tissue]
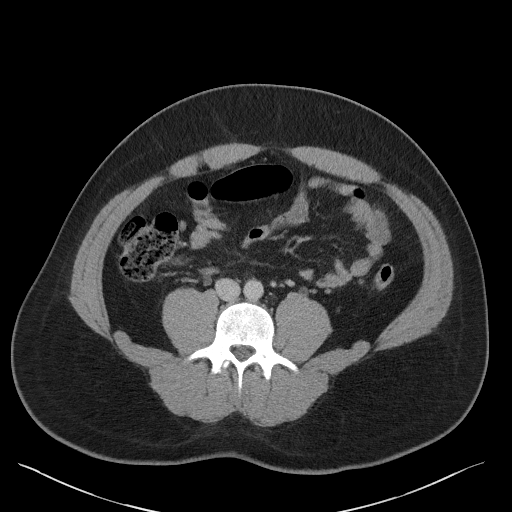
[im 54/97  soft-tissue]
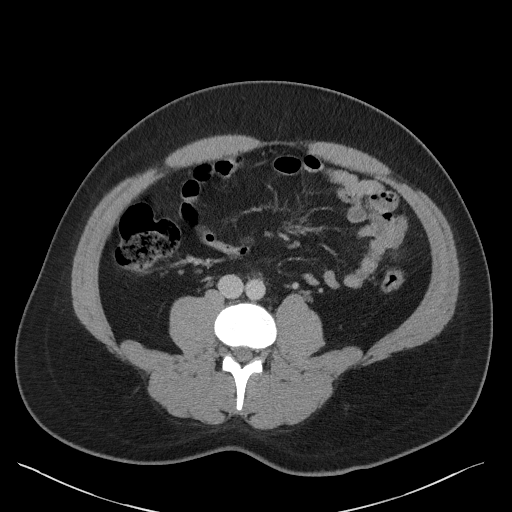
[im 62/97  soft-tissue]
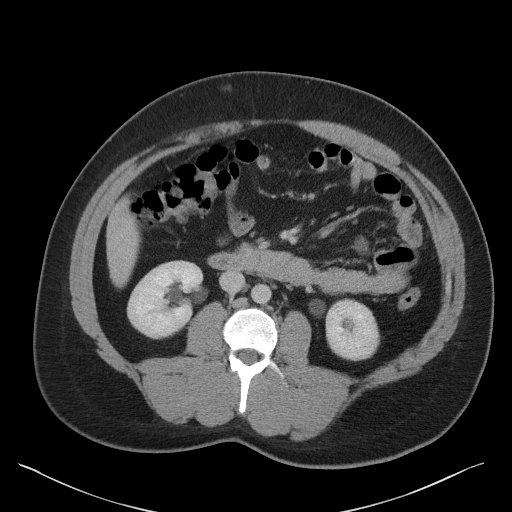
[im 62/97  bone]
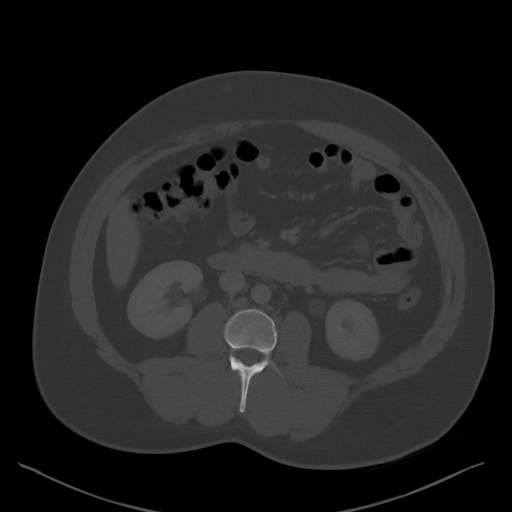
[im 70/97  soft-tissue]
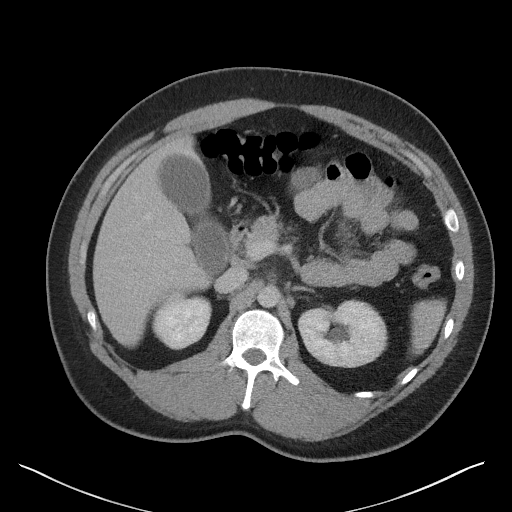
[im 77/97  soft-tissue]
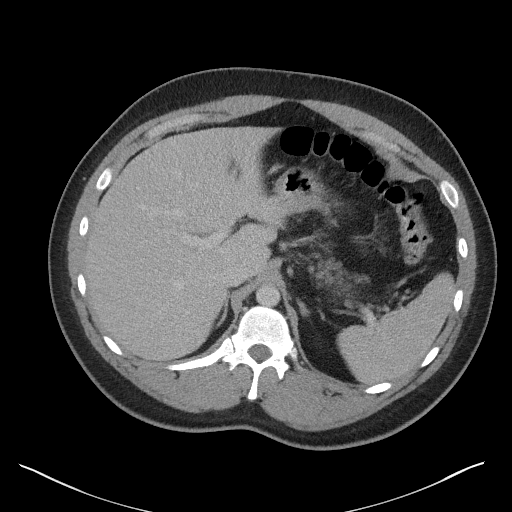
[im 85/97  soft-tissue]
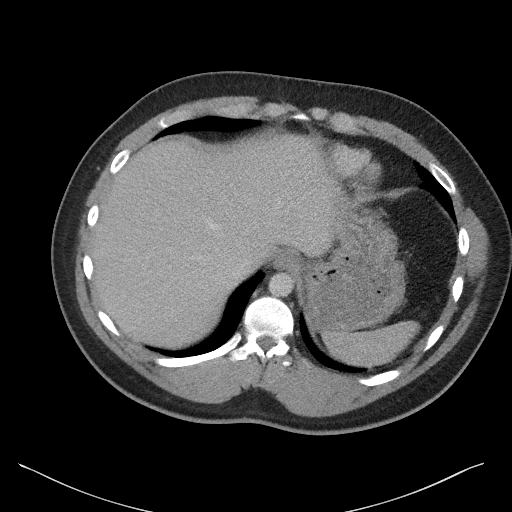
[im 93/97  soft-tissue]
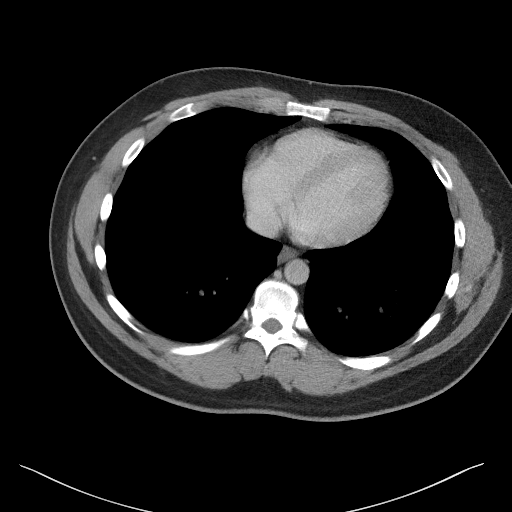

[Series 5: coronal st · coronal · 0.81mm/px · 3 of 100 slices shown]
[im 34/100  soft-tissue]
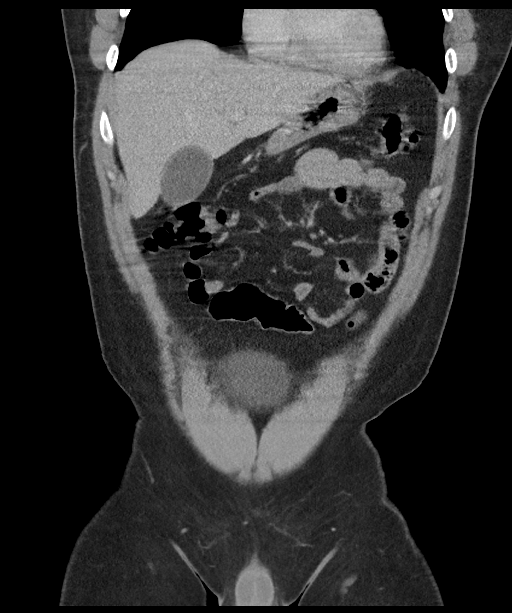
[im 45/100  soft-tissue]
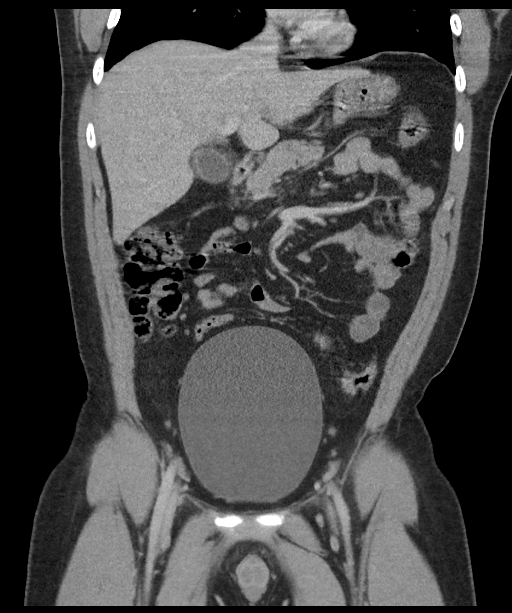
[im 56/100  soft-tissue]
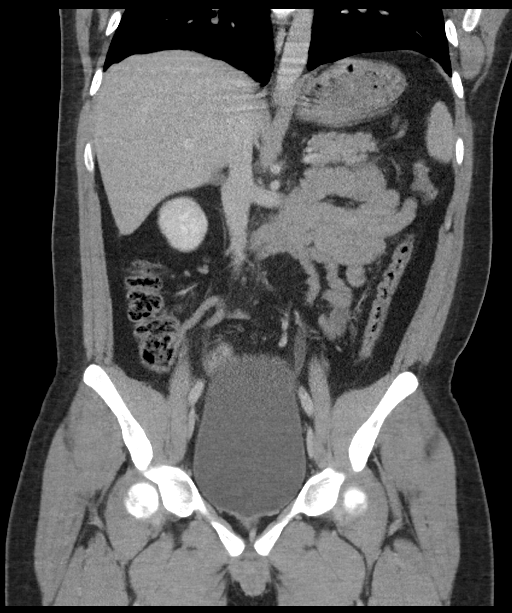

[16 of 46 positions shown; findings below may reference images not displayed]

FINDINGS: Lower chest: The visualized lung bases are clear bilaterally. The
visualized heart and pericardium are unremarkable peer

Hepatobiliary: The gallbladder is mildly distended and subtle Peri
cholecystic inflammatory changes are identified within the porta
hepatis surrounding the gallbladder neck suggesting early changes of
acute cholecystitis. No intra or extrahepatic biliary ductal
dilation. The liver is unremarkable.

Pancreas: Unremarkable

Spleen: Unremarkable

Adrenals/Urinary Tract: The adrenal glands are unremarkable. The
kidneys are normal in size and position. Normal cortical
enhancement. There is mild bilateral hydronephrosis and hydroureter
to the level of the bladder which appears moderately distended
suggesting that this may relate to either bladder outlet obstruction
or voluntary retention. No intrarenal or ureteral calculi are
identified.

Stomach/Bowel: The stomach, small bowel, and large bowel are
unremarkable. Appendix normal. No free intraperitoneal gas or fluid.

Vascular/Lymphatic: No significant vascular findings are present. No
enlarged abdominal or pelvic lymph nodes.

Reproductive: Prostate is unremarkable.

Other: Rectum unremarkable.

Musculoskeletal: No acute bone abnormality.
IMPRESSION: Mild pericholecystic inflammatory changes suggestive of early acute
cholecystitis. Correlation with liver enzymes may be helpful for
further evaluation. HIDA scan may also be helpful to assess patency
of the cystic duct.

Mild bilateral hydronephrosis and moderate bladder distension
suggesting changes of either bladder outlet obstruction or voluntary
retention.

## 2022-03-11 IMAGING — US US ABDOMEN LIMITED
1 series · 14 of 25 positions shown · non-contrast
Comparison: CT same date.

CLINICAL DATA: Epigastric and right upper quadrant abdominal pain
for 1 year, worsening over the last week. Gallbladder abnormality on
CT.

EXAM:
ULTRASOUND ABDOMEN LIMITED RIGHT UPPER QUADRANT

[Series 1: us abdomen limited · 14 of 47 slices shown]
[im 1/47]
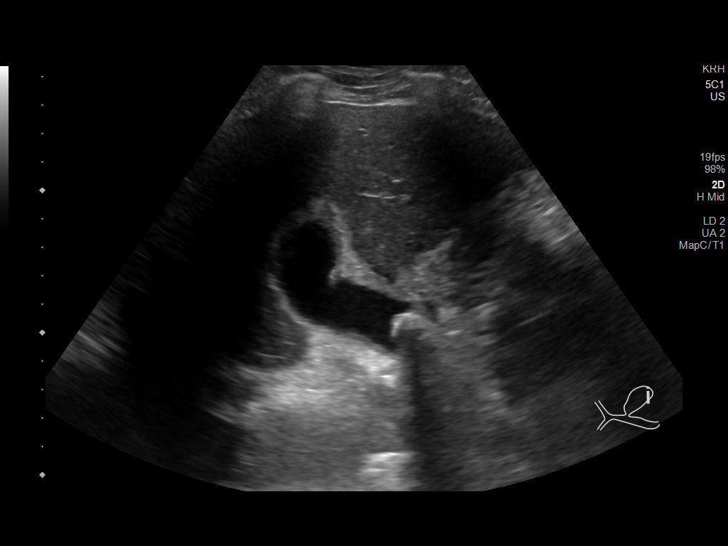
[im 4/47]
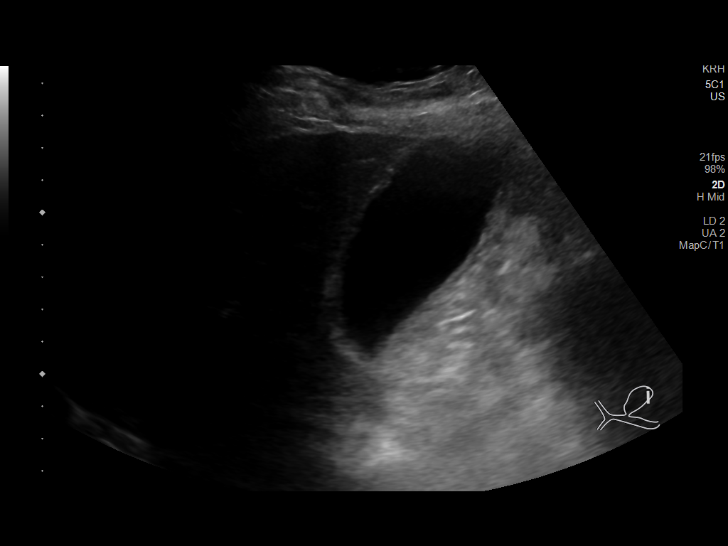
[im 8/47]
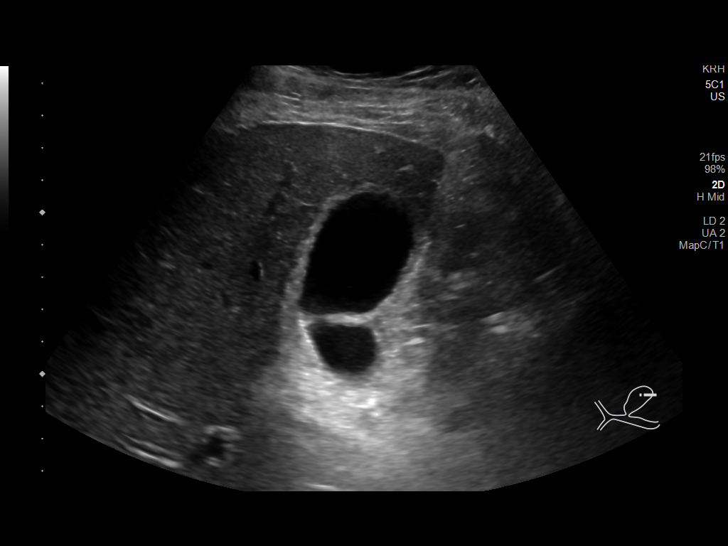
[im 12/47]
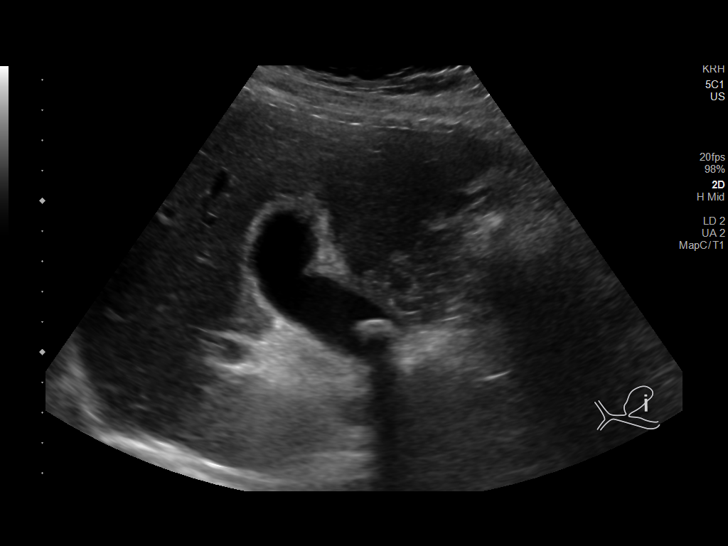
[im 16/47]
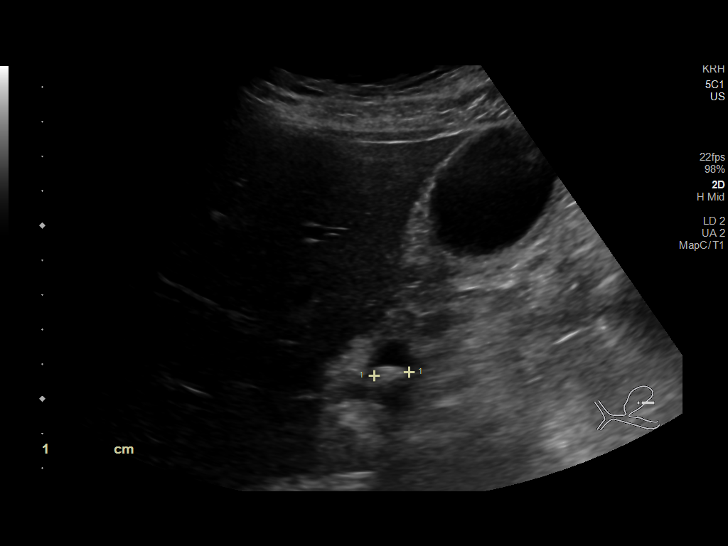
[im 18/47]
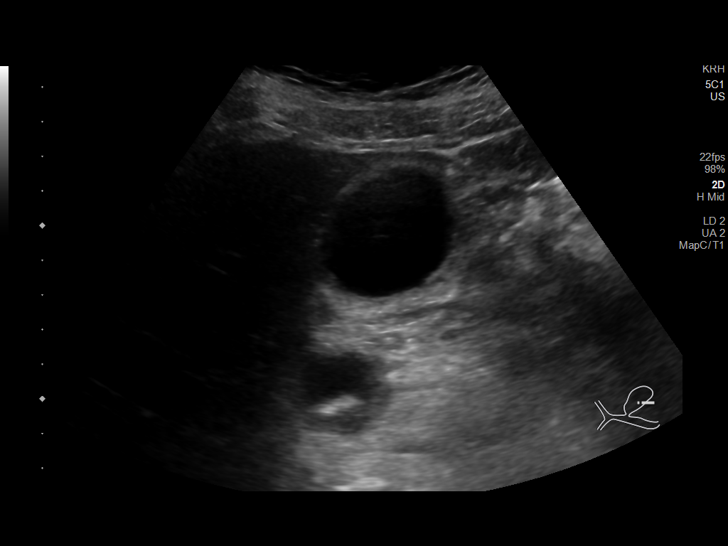
[im 22/47]
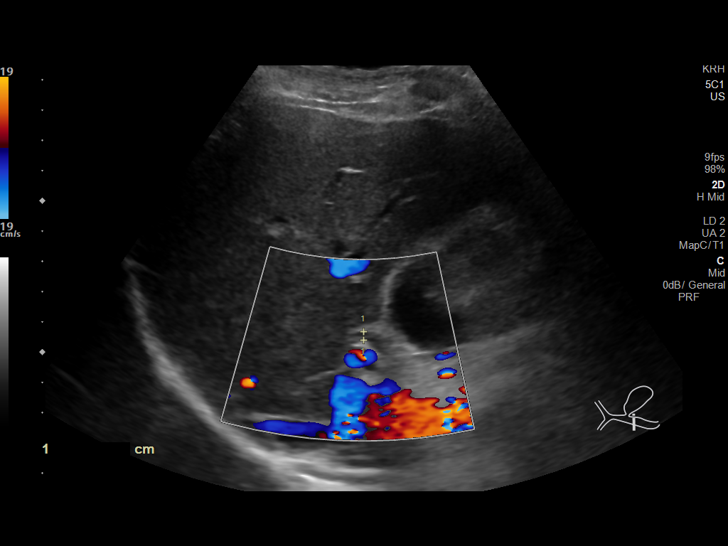
[im 25/47]
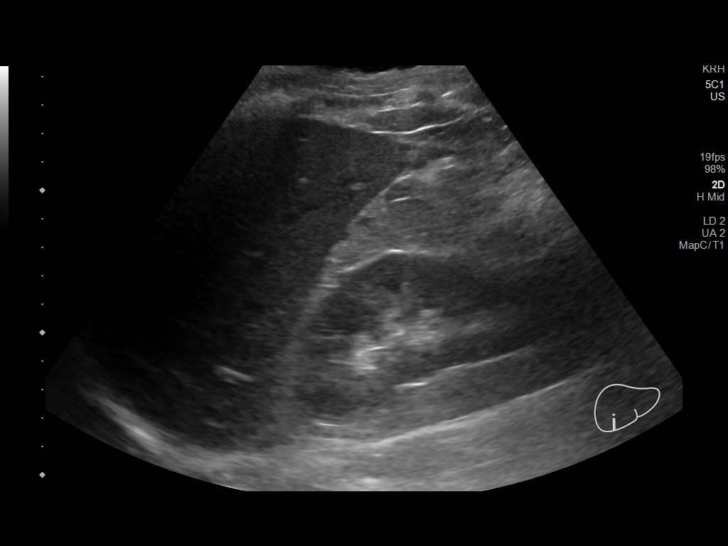
[im 29/47]
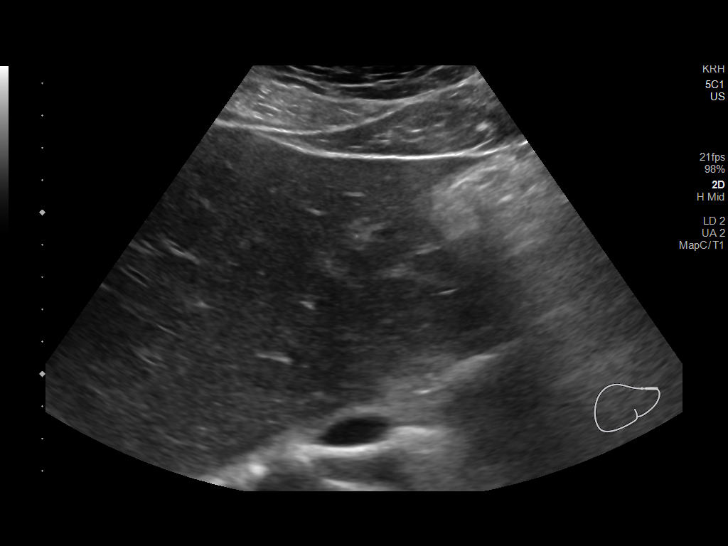
[im 31/47]
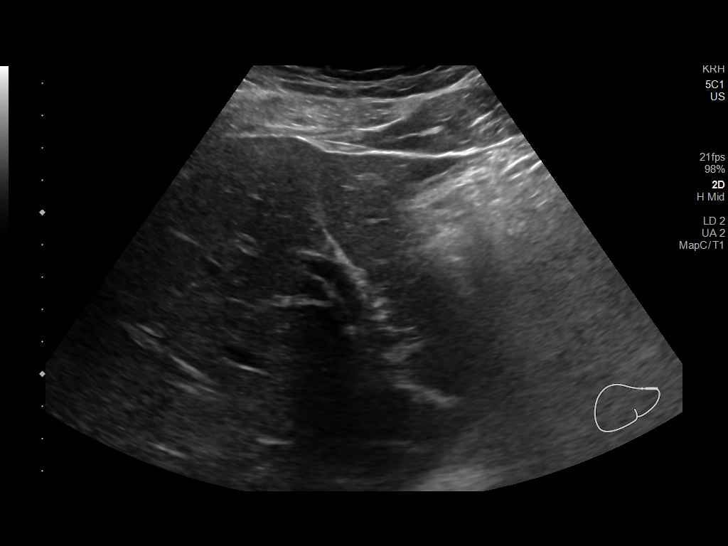
[im 35/47]
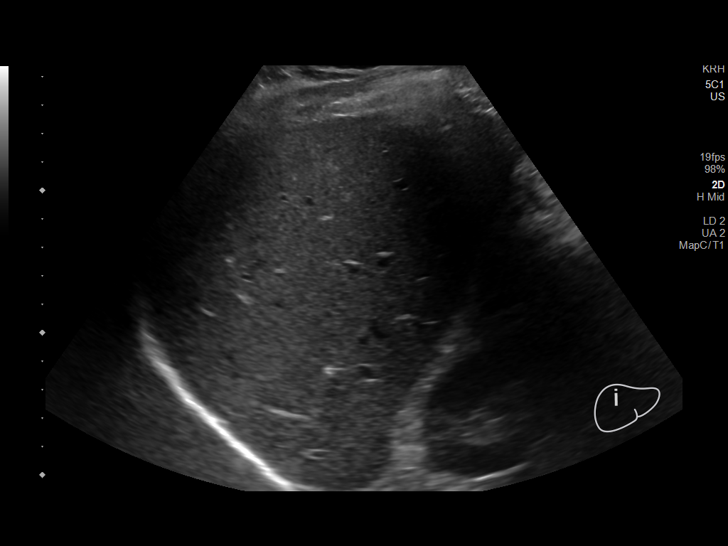
[im 39/47]
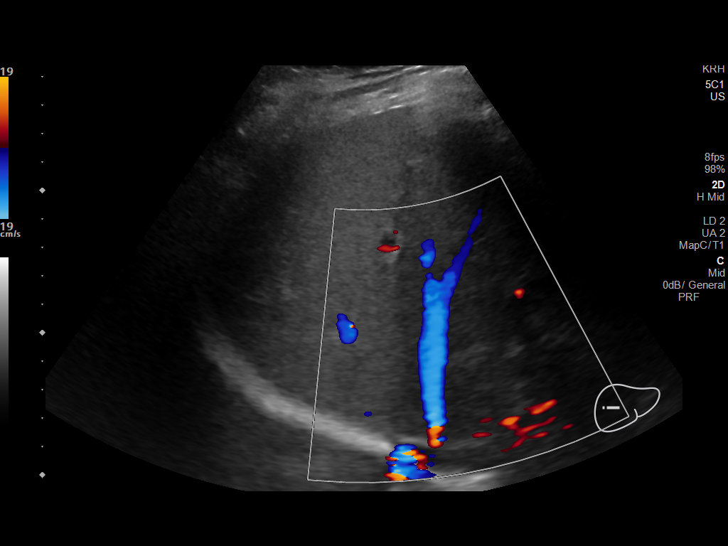
[im 43/47]
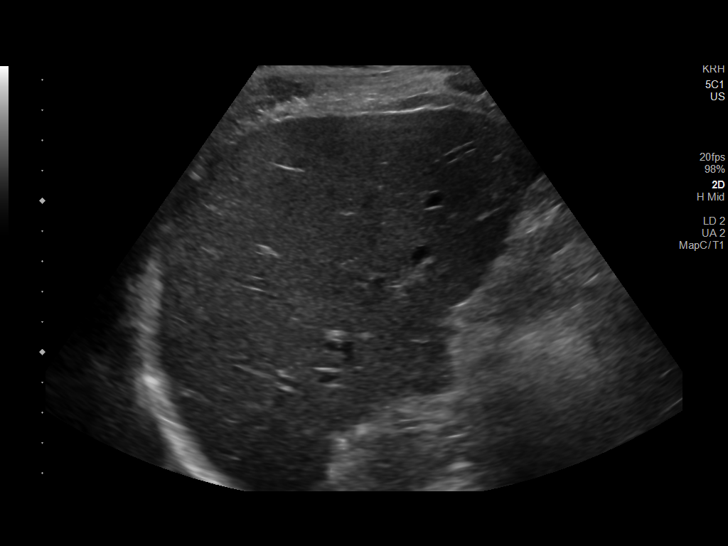
[im 47/47]
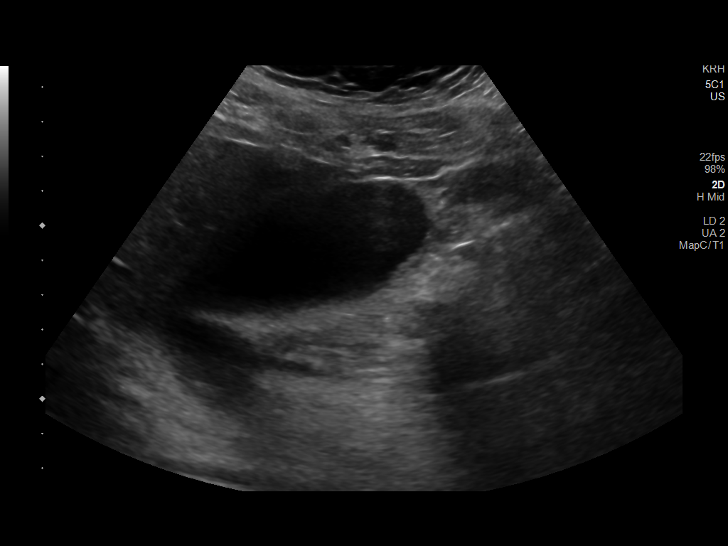

[14 of 25 positions shown; findings below may reference images not displayed]

FINDINGS: Gallbladder:

There is an immobile 14 mm gallstone in the gallbladder neck. Mild
gallbladder thickening to 4 mm is present. According to the
sonographer, there is a sonographic Murphy sign.

Common bile duct:

Diameter: 3 mm

Liver:

No focal lesion identified. Within normal limits in parenchymal
echogenicity. Portal vein is patent on color Doppler imaging with
normal direction of blood flow towards the liver.

Other: None.
IMPRESSION: 1. Nonmobile gallstone in the gallbladder neck with mild gallbladder
wall thickening and a sonographic Murphy sign. Findings are
supportive of acute cholecystitis as suggested on earlier CT.
2. No evidence of biliary dilatation.
